# Patient Record
Sex: Female | Born: 1950 | Race: Black or African American | Hispanic: No | Smoking: Former smoker
Health system: Southern US, Community
[De-identification: ages and names within clinical notes are randomized; demographics above are authoritative.]

## PROBLEM LIST (undated history)

## (undated) DIAGNOSIS — F32A Depression, unspecified: Secondary | ICD-10-CM

## (undated) DIAGNOSIS — M810 Age-related osteoporosis without current pathological fracture: Secondary | ICD-10-CM

## (undated) HISTORY — DX: Age-related osteoporosis without current pathological fracture: M81.0

## (undated) HISTORY — DX: Depression, unspecified: F32.A

## (undated) HISTORY — PX: ABDOMINAL HYSTERECTOMY: SHX81

---

## 1992-12-19 LAB — HM PAP SMEAR: HM Pap smear: NEGATIVE

## 2004-01-01 ENCOUNTER — Other Ambulatory Visit: Payer: Self-pay

## 2005-05-27 ENCOUNTER — Emergency Department: Payer: Self-pay | Admitting: Emergency Medicine

## 2006-09-21 ENCOUNTER — Emergency Department (HOSPITAL_COMMUNITY): Admission: EM | Admit: 2006-09-21 | Discharge: 2006-09-21 | Payer: Self-pay | Admitting: Emergency Medicine

## 2007-10-09 ENCOUNTER — Emergency Department: Payer: Self-pay | Admitting: Emergency Medicine

## 2007-12-17 ENCOUNTER — Ambulatory Visit: Payer: Self-pay | Admitting: Internal Medicine

## 2010-11-15 ENCOUNTER — Encounter: Payer: Self-pay | Admitting: Family Medicine

## 2010-11-15 ENCOUNTER — Ambulatory Visit: Payer: Self-pay | Admitting: Family Medicine

## 2010-11-15 DIAGNOSIS — N760 Acute vaginitis: Secondary | ICD-10-CM

## 2010-11-15 DIAGNOSIS — R7303 Prediabetes: Secondary | ICD-10-CM | POA: Insufficient documentation

## 2010-11-15 DIAGNOSIS — R7309 Other abnormal glucose: Secondary | ICD-10-CM | POA: Insufficient documentation

## 2010-11-15 LAB — CONVERTED CEMR LAB
Bilirubin Urine: NEGATIVE
Blood in Urine, dipstick: NEGATIVE
Glucose, Urine, Semiquant: NEGATIVE
Nitrite: NEGATIVE
Specific Gravity, Urine: 1.01

## 2010-11-23 NOTE — Assessment & Plan Note (Signed)
Summary: BATERICAL INFECTION   Vital Signs:  Patient Profile:   60 Years Old Female CC:      Vaginal Discharge/Odor Weight:      117 pounds O2 Sat:      99 % O2 treatment:    Room Air Temp:     98.5 degrees F oral Pulse rate:   76 / minute Pulse rhythm:   regular Resp:     12 per minute BP sitting:   100 / 70  (right arm)  Pt. in pain?   no                   Current Allergies (reviewed today): No known allergies History of Present Illness History from: patient Reason for visit: see chief complaint Chief Complaint: Vaginal Discharge/Odor History of Present Illness: The patient is presenting today complaining of 2 weeks of noticing a fishy vaginal discharge.  She initially had some burning with urination and frequency of urination progressing to fishy vaginal odor, vaginal discharge; She had been using vaginal powder and monistat and also using some vaginal deodorant sprays with no improvement in the vaginal odors.  She said that she spoke with her pharmacist and was told that she probably had a vaginal infection.  She has not been sexually active since losing her husband 3 years ago.  She otherwise had been concerned about being a diabetic.  She said that she has not had medical care in 3 years.  She said that she was told years ago that she was "borderline diabetic" but she was never able to follow up and check on this.    REVIEW OF SYSTEMS Constitutional Symptoms       Complains of fever and fatigue.     Denies chills, night sweats, weight loss, and weight gain.  Eyes       Complains of glasses.      Denies change in vision, eye pain, eye discharge, contact lenses, and eye surgery. Ear/Nose/Throat/Mouth       Denies hearing loss/aids, change in hearing, ear pain, ear discharge, dizziness, frequent runny nose, frequent nose bleeds, sinus problems, sore throat, hoarseness, and tooth pain or bleeding.  Respiratory       Denies dry cough, productive cough, wheezing, shortness of  breath, asthma, bronchitis, and emphysema/COPD.  Cardiovascular       Denies murmurs, chest pain, and tires easily with exhertion.    Gastrointestinal       Denies stomach pain, nausea/vomiting, diarrhea, constipation, blood in bowel movements, and indigestion. Genitourniary       Denies painful urination, kidney stones, and loss of urinary control. Neurological       Complains of headaches.      Denies paralysis, seizures, and fainting/blackouts. Musculoskeletal       Denies muscle pain, joint pain, joint stiffness, decreased range of motion, redness, swelling, muscle weakness, and gout.  Skin       Denies bruising, unusual mles/lumps or sores, and hair/skin or nail changes.  Psych       Denies mood changes, temper/anger issues, anxiety/stress, speech problems, depression, and sleep problems.  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  She lives in [place], [place].  No ETOH, recreational drugs, or tobacco use reported.  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  She lives in [place], [place].  No ETOH, recreational drugs, or tobacco use reported.  Physical Exam General appearance: well developed, well nourished, no acute distress Head: normocephalic, atraumatic Eyes: conjunctivae and lids normal Pupils: equal, round, reactive to light Ears: normal, no lesions or deformities Nasal: mucosa pink,  nonedematous, no septal deviation, turbinates normal Oral/Pharynx: tongue normal, posterior pharynx without erythema or exudate Neck: neck supple,  trachea midline, no masses Chest/Lungs: no rales, wheezes, or rhonchi bilateral, breath sounds equal without effort Heart: regular rate and  rhythm, no murmur GU: deferred Extremities: normal extremities Neurological: grossly intact and non-focal Skin: no obvious rashes or lesions MSE: oriented to time, place, and person Assessment New Problems: VAGINITIS, BACTERIAL (ICD-616.10) Hx of DIABETES MELLITUS, BORDERLINE (ICD-790.29)   Patient Education: The risks, benefits and possible side effects were clearly explained and discussed with the patient.  The patient verbalized clear understanding.  The patient was given instructions to return if symptoms don't improve, worsen or new changes develop.  If it is not during clinic hours and the patient cannot get back to this clinic then the patient was told to seek medical care at an available urgent care or emergency department.  The patient verbalized understanding.   Demonstrates willingness to comply.  Plan New Medications/Changes: METRONIDAZOLE 500 MG TABS (METRONIDAZOLE) take 1 by mouth two times a day  #14 x 0, 11/15/2010, Standley Dakins MD  Follow Up: Follow up in 2-3 days if no improvement, Follow up on an as needed basis, Follow up with Primary Physician  The patient and/or caregiver has been counseled thoroughly with regard to medications prescribed including dosage, schedule, interactions, rationale for use, and possible side effects and they verbalize understanding.  Diagnoses and expected course of recovery discussed and will return if not improved as expected or if the condition worsens. Patient and/or caregiver verbalized understanding.  Prescriptions: METRONIDAZOLE 500 MG TABS (METRONIDAZOLE) take 1 by mouth two times a day  #14 x 0   Entered and Authorized by:   Standley Dakins  MD   Signed by:   Standley Dakins MD on 11/15/2010   Method used:   Electronically to        [facility] (retail)       [address]       [place], [place]  [address]       Ph: [phone]       Fax: [phone]   RxID:   [id number]   Patient Instructions: 1)  Go to the pharmacy and pick up your prescription (s).  It may take up to 30 mins for electronic prescriptions to be delivered to the pharmacy.  Please call if your pharmacy has not received your prescriptions after 30 minutes.   2)  Take your antibiotic as prescribed until ALL of it is gone, but stop if you develop a rash or swelling and contact our office as soon as possible. 3)  Return or go to the ER if no improvement or symptoms getting worse.   4)  Please call us back here  if no improvement after 2 days of antibiotics.  5)  The patient was informed that there is no on-call provider or services available at this clinic during off-hours (when the clinic is closed).  If the patient developed a problem or concern that required immediate attention, the patient was advised to go the the nearest available urgent care or emergency department for medical care.  The patient verbalized understanding.       Lab Results    Ordered by:  Standley Dakins MD    Date tests performed: 11/15/2010    Performed by:  Standley Dakins MD    Glu:     100  mg/dL Urinalysis:      Color:     Yellow    Appear:     Clear    Leuk:     Neg    Nitr:     Neg    Urobil:     0.2    Prot:     Neg    pH:     6.5    Blood:     Neg    Sp. Gr:     1.010    Ket:     Neg    Bili:     Neg    Glu:     Neg  I explained to the patient that I did not have a nurse chaperone available and therefore deferred a pelvic exam. I explained to the patient that if symptoms did not improve or if they got any worse that she should return.  The patient verbalized clear understanding.  Rodney Langton, MD, CDE, [employer]

## 2010-12-02 ENCOUNTER — Encounter: Payer: Self-pay | Admitting: Family Medicine

## 2011-09-27 ENCOUNTER — Ambulatory Visit: Payer: Self-pay | Admitting: Internal Medicine

## 2011-09-27 LAB — URINALYSIS, COMPLETE
Glucose,UR: NEGATIVE mg/dL (ref 0–75)
Ketone: NEGATIVE
Specific Gravity: 1.025 (ref 1.003–1.030)

## 2011-09-29 LAB — URINE CULTURE

## 2012-03-18 ENCOUNTER — Ambulatory Visit: Payer: Self-pay | Admitting: Pediatrics

## 2012-05-15 LAB — HM HEPATITIS C SCREENING LAB: HM Hepatitis Screen: NEGATIVE

## 2013-09-01 ENCOUNTER — Emergency Department: Payer: Self-pay | Admitting: Emergency Medicine

## 2013-09-01 LAB — CBC WITH DIFFERENTIAL/PLATELET
Basophil %: 0.6 %
Eosinophil #: 0.1 10*3/uL (ref 0.0–0.7)
Lymphocyte #: 2 10*3/uL (ref 1.0–3.6)
Lymphocyte %: 21.2 %
MCH: 30.3 pg (ref 26.0–34.0)
MCHC: 33.4 g/dL (ref 32.0–36.0)
MCV: 91 fL (ref 80–100)
Neutrophil %: 67.6 %
RBC: 4.4 10*6/uL (ref 3.80–5.20)
RDW: 12.3 % (ref 11.5–14.5)

## 2013-09-01 LAB — BASIC METABOLIC PANEL
BUN: 9 mg/dL (ref 7–18)
Calcium, Total: 9.8 mg/dL (ref 8.5–10.1)
Co2: 28 mmol/L (ref 21–32)
EGFR (African American): >60
EGFR (Non-African Amer.): >60
Osmolality: 271 (ref 275–301)
Sodium: 136 mmol/L (ref 136–145)

## 2013-09-01 LAB — URINALYSIS, COMPLETE
Bilirubin,UR: NEGATIVE
Leukocyte Esterase: NEGATIVE
Ph: 6 (ref 4.5–8.0)
Protein: NEGATIVE
WBC UR: NONE SEEN /HPF (ref 0–5)

## 2015-04-11 ENCOUNTER — Other Ambulatory Visit: Payer: Self-pay | Admitting: Family Medicine

## 2015-04-11 DIAGNOSIS — Z1231 Encounter for screening mammogram for malignant neoplasm of breast: Secondary | ICD-10-CM

## 2015-04-11 DIAGNOSIS — F339 Major depressive disorder, recurrent, unspecified: Secondary | ICD-10-CM | POA: Insufficient documentation

## 2015-04-13 ENCOUNTER — Ambulatory Visit
Admission: RE | Admit: 2015-04-13 | Discharge: 2015-04-13 | Disposition: A | Discharge status: Home / Self Care | Payer: No Typology Code available for payment source | Source: Ambulatory Visit | Attending: Family Medicine | Admitting: Family Medicine

## 2015-04-13 DIAGNOSIS — Z1231 Encounter for screening mammogram for malignant neoplasm of breast: Secondary | ICD-10-CM | POA: Insufficient documentation

## 2015-06-04 ENCOUNTER — Encounter: Payer: Self-pay | Admitting: Emergency Medicine

## 2015-06-04 ENCOUNTER — Emergency Department
Admission: EM | Admit: 2015-06-04 | Discharge: 2015-06-04 | Disposition: A | Discharge status: Home / Self Care | Payer: No Typology Code available for payment source | Attending: Emergency Medicine | Admitting: Emergency Medicine

## 2015-06-04 ENCOUNTER — Emergency Department: Payer: No Typology Code available for payment source

## 2015-06-04 DIAGNOSIS — S20219A Contusion of unspecified front wall of thorax, initial encounter: Secondary | ICD-10-CM | POA: Insufficient documentation

## 2015-06-04 DIAGNOSIS — S3991XA Unspecified injury of abdomen, initial encounter: Secondary | ICD-10-CM | POA: Insufficient documentation

## 2015-06-04 DIAGNOSIS — Y998 Other external cause status: Secondary | ICD-10-CM | POA: Insufficient documentation

## 2015-06-04 DIAGNOSIS — Z87891 Personal history of nicotine dependence: Secondary | ICD-10-CM | POA: Diagnosis not present

## 2015-06-04 DIAGNOSIS — Y9389 Activity, other specified: Secondary | ICD-10-CM | POA: Diagnosis not present

## 2015-06-04 DIAGNOSIS — S0990XA Unspecified injury of head, initial encounter: Secondary | ICD-10-CM | POA: Diagnosis not present

## 2015-06-04 DIAGNOSIS — S161XXA Strain of muscle, fascia and tendon at neck level, initial encounter: Secondary | ICD-10-CM | POA: Insufficient documentation

## 2015-06-04 DIAGNOSIS — Y92481 Parking lot as the place of occurrence of the external cause: Secondary | ICD-10-CM | POA: Insufficient documentation

## 2015-06-04 DIAGNOSIS — S199XXA Unspecified injury of neck, initial encounter: Secondary | ICD-10-CM | POA: Diagnosis present

## 2015-06-04 MED ORDER — CYCLOBENZAPRINE HCL 10 MG PO TABS: 10.0000 mg | ORAL_TABLET | Freq: Every evening | ORAL | Status: AC | PRN

## 2015-06-04 MED ORDER — KETOROLAC TROMETHAMINE 30 MG/ML IJ SOLN
30.0000 mg | Freq: Once | INTRAMUSCULAR | Status: AC
  Administered 2015-06-04: 30 mg via INTRAMUSCULAR
  Filled 2015-06-04: qty 1

## 2015-06-04 MED ORDER — NAPROXEN 500 MG PO TABS: 500.0000 mg | ORAL_TABLET | Freq: Two times a day (BID) | ORAL | Status: AC
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
## 2016-04-12 LAB — LIPID PANEL
Cholesterol: 162 (ref 0–200)
HDL: 52 (ref 35–70)
LDL Cholesterol: 88
LDl/HDL Ratio: 3.1
Triglycerides: 110 (ref 40–160)
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
## 2018-05-21 LAB — BASIC METABOLIC PANEL
BUN: 11 (ref 4–21)
CO2: 34 — AB (ref 13–22)
Chloride: 106 (ref 99–108)
Creatinine: 0.7 (ref 0.5–1.1)
Glucose: 85
Potassium: 4.3 (ref 3.4–5.3)
Sodium: 141 (ref 137–147)

## 2018-05-21 LAB — HEPATIC FUNCTION PANEL
ALT: 18 (ref 7–35)
AST: 15 (ref 13–35)
Alkaline Phosphatase: 73 (ref 25–125)
Bilirubin, Total: 0.2

## 2018-05-21 LAB — HEMOGLOBIN A1C: Hemoglobin A1C: 5.7

## 2018-05-21 LAB — COMPREHENSIVE METABOLIC PANEL: Calcium: 9.2 (ref 8.7–10.7)

## 2018-10-13 ENCOUNTER — Other Ambulatory Visit: Payer: Self-pay

## 2018-10-13 ENCOUNTER — Emergency Department
Admission: EM | Admit: 2018-10-13 | Discharge: 2018-10-13 | Disposition: A | Discharge status: Home / Self Care | Payer: Medicare HMO | Attending: Emergency Medicine | Admitting: Emergency Medicine

## 2018-10-13 DIAGNOSIS — R21 Rash and other nonspecific skin eruption: Secondary | ICD-10-CM | POA: Diagnosis present

## 2018-10-13 DIAGNOSIS — B029 Zoster without complications: Secondary | ICD-10-CM | POA: Diagnosis not present

## 2018-10-13 DIAGNOSIS — E119 Type 2 diabetes mellitus without complications: Secondary | ICD-10-CM | POA: Diagnosis not present

## 2018-10-13 DIAGNOSIS — Z87891 Personal history of nicotine dependence: Secondary | ICD-10-CM | POA: Diagnosis not present

## 2018-10-13 MED ORDER — OXYCODONE-ACETAMINOPHEN 5-325 MG PO TABS: 1.0000 | ORAL_TABLET | Freq: Three times a day (TID) | ORAL | 0 refills | Status: AC | PRN

## 2018-10-13 MED ORDER — VALACYCLOVIR HCL 1 G PO TABS: 1000.0000 mg | ORAL_TABLET | Freq: Three times a day (TID) | ORAL | 0 refills | Status: AC

## 2018-10-13 NOTE — ED Provider Notes (Signed)
[facility] Emergency Department Provider Note  ____________________________________________  Time seen: Approximately 8:59 PM  I have reviewed the triage vital signs and the nursing notes.   HISTORY  Chief Complaint Rash    HPI Mercedes Mcintosh is a 68 y.o. female presents to the emergency department with an erythematous rash with vesicular formation along the right side of abdomen with a prodrome of burning.  Rash does not cross the midline.  No fever or chills. No alleviating measures have been attempted.   History reviewed. No pertinent past medical history.  Patient Active Problem List   Diagnosis Date Noted  . DIABETES MELLITUS, BORDERLINE 11/15/2010    Past Surgical History:  Procedure Laterality Date  . ABDOMINAL HYSTERECTOMY      Prior to Admission medications   Medication Sig Start Date End Date Taking? Authorizing Provider  oxyCODONE-acetaminophen (PERCOCET/ROXICET) 5-325 MG tablet Take 1 tablet by mouth every 8 (eight) hours as needed for up to 3 days for severe pain. 10/13/18 10/16/18  Orvil FeilWoods, Erric Machnik M, PA-C  valACYclovir (VALTREX) 1000 MG tablet Take 1 tablet (1,000 mg total) by mouth 3 (three) times daily for 7 days. 10/13/18 10/20/18  Orvil FeilWoods, Irianna Gilday M, PA-C    Allergies Patient has no known allergies.  Family History  Problem Relation Age of Onset  . Breast cancer Paternal Aunt     Social History Social History   Tobacco Use  . Smoking status: Former Games developermoker  . Smokeless tobacco: Never Used  Substance Use Topics  . Alcohol use: No  . Drug use: No     Review of Systems  Constitutional: No fever/chills Eyes: No visual changes. No discharge ENT: No upper respiratory complaints. Cardiovascular: no chest pain. Respiratory: no cough. No SOB. Gastrointestinal: No abdominal pain.  No nausea, no vomiting.  No diarrhea.  No constipation. Genitourinary: Negative for dysuria. No hematuria Musculoskeletal: Negative for musculoskeletal  pain. Skin: Patient has rash.  Neurological: Negative for headaches, focal weakness or numbness.   ____________________________________________   PHYSICAL EXAM:  VITAL SIGNS: ED Triage Vitals  Enc Vitals Group     BP 10/13/18 1930 116/63     Pulse Rate 10/13/18 1930 90     Resp 10/13/18 1930 17     Temp 10/13/18 1930 98.3 F (36.8 C)     Temp Source 10/13/18 1930 Oral     SpO2 10/13/18 1930 97 %     Weight 10/13/18 1928 118 lb (53.5 kg)     Height 10/13/18 1928 5\' 4"  (1.626 m)     Head Circumference --      Peak Flow --      Pain Score 10/13/18 1928 10     Pain Loc --      Pain Edu? --      Excl. in GC? --      Constitutional: Alert and oriented. Well appearing and in no acute distress. Eyes: Conjunctivae are normal. PERRL. EOMI. Head: Atraumatic. Cardiovascular: Normal rate, regular rhythm. Normal S1 and S2.  Good peripheral circulation. Respiratory: Normal respiratory effort without tachypnea or retractions. Lungs CTAB. Good air entry to the bases with no decreased or absent breath sounds. Gastrointestinal: Bowel sounds 4 quadrants. Soft and nontender to palpation. No guarding or rigidity. No palpable masses. No distention. No CVA tenderness. Musculoskeletal: Full range of motion to all extremities. No gross deformities appreciated. Neurologic:  Normal speech and language. No gross focal neurologic deficits are appreciated.  Skin: Patient has an erythematous, macular rash with vesicular formation  along the right abdomen that does not cross the midline. Psychiatric: Mood and affect are normal. Speech and behavior are normal. Patient exhibits appropriate insight and judgement.   ____________________________________________   LABS (all labs ordered are listed, but only abnormal results are displayed)  Labs Reviewed - No data to display ____________________________________________  EKG   ____________________________________________  RADIOLOGY   No results  found.  ____________________________________________    PROCEDURES  Procedure(s) performed:    Procedures    Medications - No data to display   ____________________________________________   INITIAL IMPRESSION / ASSESSMENT AND PLAN / ED COURSE  Pertinent labs & imaging results that were available during my care of the patient were reviewed by me and considered in my medical decision making (see chart for details).  Review of the [place] CSRS was performed in accordance of the [facility] prior to dispensing any controlled drugs.  Assessment and Plan:  Shingles Patient presents to the emergency department with a rash consistent with shingles.  Patient was discharged with Valtrex and Roxicet.  She was advised to follow-up with primary care as needed.  All patient questions were answered.    ____________________________________________  FINAL CLINICAL IMPRESSION(S) / ED DIAGNOSES  Final diagnoses:  Herpes zoster without complication      NEW MEDICATIONS STARTED DURING THIS VISIT:  ED Discharge Orders         Ordered    valACYclovir (VALTREX) 1000 MG tablet  3 times daily     10/13/18 2054    oxyCODONE-acetaminophen (PERCOCET/ROXICET) 5-325 MG tablet  Every 8 hours PRN     10/13/18 2055              This chart was dictated using voice recognition software/Dragon. Despite best efforts to proofread, errors can occur which can change the meaning. Any change was purely unintentional.    Gasper Lloyd 10/13/18 2101    Rockne Menghini, MD 10/13/18 [phone]

## 2018-10-13 NOTE — ED Triage Notes (Signed)
Pt arrives to ED via POV from home with rash x4 days. Pt presents with raised red rash to right upper abdomen and under the right breast. Pt reports rash is painful and burning. Pt reports having chicken pox as a child. No fever. No recent travel.

## 2018-10-13 NOTE — ED Notes (Addendum)
Pt states having a "rash" for the past 3-4days that looks like shingles. Pt states nausea, jitteriness, pain and redness that is spreading. Family at bedside.

## 2019-06-27 ENCOUNTER — Emergency Department: Payer: Medicare HMO

## 2019-06-27 ENCOUNTER — Other Ambulatory Visit: Payer: Self-pay

## 2019-06-27 ENCOUNTER — Encounter: Payer: Self-pay | Admitting: Emergency Medicine

## 2019-06-27 ENCOUNTER — Emergency Department
Admission: EM | Admit: 2019-06-27 | Discharge: 2019-06-27 | Disposition: A | Discharge status: Home / Self Care | Payer: Medicare HMO | Attending: Emergency Medicine | Admitting: Emergency Medicine

## 2019-06-27 DIAGNOSIS — T180XXA Foreign body in mouth, initial encounter: Secondary | ICD-10-CM

## 2019-06-27 DIAGNOSIS — R0989 Other specified symptoms and signs involving the circulatory and respiratory systems: Secondary | ICD-10-CM | POA: Diagnosis not present

## 2019-06-27 DIAGNOSIS — Z20828 Contact with and (suspected) exposure to other viral communicable diseases: Secondary | ICD-10-CM | POA: Diagnosis not present

## 2019-06-27 DIAGNOSIS — Z87891 Personal history of nicotine dependence: Secondary | ICD-10-CM | POA: Insufficient documentation

## 2019-06-27 DIAGNOSIS — E119 Type 2 diabetes mellitus without complications: Secondary | ICD-10-CM | POA: Insufficient documentation

## 2019-06-27 DIAGNOSIS — Z20822 Contact with and (suspected) exposure to covid-19: Secondary | ICD-10-CM

## 2019-06-27 MED ORDER — ONDANSETRON 4 MG PO TBDP: 4.0000 mg | ORAL_TABLET | Freq: Three times a day (TID) | ORAL | 0 refills | Status: DC | PRN

## 2019-06-27 MED ORDER — EXCEDRIN MIGRAINE 250-250-65 MG PO TABS: 1.0000 | ORAL_TABLET | Freq: Four times a day (QID) | ORAL | 0 refills | Status: DC | PRN

## 2019-06-27 NOTE — ED Notes (Addendum)
Refer to triage note: st would like to get tested for COVID. Pt st would like to speak to a provider for further evaluation. Pt st bottom tooth is sore, headache, nausea after biting down on  a tooth from a biscuit.

## 2019-06-27 NOTE — ED Notes (Signed)
FIRST NURSE NOTE:  Pt states she ate a sausage biscuit this morning, states when she bit down she bit into someone's tooth that was in the sandwich. Pt states it was not her tooth. Pt states she has photo on her phone.  Pt also concerned about covid and wants a covid test.

## 2019-06-27 NOTE — ED Triage Notes (Signed)
Pt wants a covid test to make sure she did not get covid from possibly eating a piece of tooth from a biscuit this morning.  Informed patient would not get covid that quickly but pt would like test anyway.  Pt initially reported no symptoms but then said her throat was a little scratchy. No choking. Pt reports she has tooth with her and a picture of it.

## 2019-06-27 NOTE — ED Provider Notes (Signed)
[facility] Emergency Department Provider Note  ____________________________________________  Time seen: Approximately 8:21 PM  I have reviewed the triage vital signs and the nursing notes.   HISTORY  Chief Complaint Foreign Body    HPI Mercedes Mcintosh is a 68 y.o. female who presents the emergency department concern over possibly swallowing a foreign body.  Patient reports that this morning she went to basketball for viscus for herself and her grandson.  She was eating a biscuit when she felt a crunch sensation.  Patient states that at first she thought she had eaten some bristle but when she spit it out it appeared toothlike.  Patient was in disbelief that it was a tooth so she continued to eat her biscuit.  Patient reports that she felt another crunch, spit out what appears to be a filling, likely from tooth like object in her biscuit.  Patient does not believe she swallowed any foreign body but is unsure.  She presents for evaluation of this.  She states that she has a partial on the upper so this did not make direct contact with her own dentition but she is having pain underlying the partial from contact with a foreign body.  Patient also request COVID-19 testing.  She has no fevers or chills, nasal congestion, sore throat, cough.  She states that she was wanting to get tested prior to this, but after having a potential foreign object from another person inside her mouth she especially wants testing at this time.  No other complaints at this time.  No medications prior to arrival.         History reviewed. No pertinent past medical history.  Patient Active Problem List   Diagnosis Date Noted  . DIABETES MELLITUS, BORDERLINE 11/15/2010    Past Surgical History:  Procedure Laterality Date  . ABDOMINAL HYSTERECTOMY      Prior to Admission medications   Medication Sig Start Date End Date Taking? Authorizing Provider  aspirin-acetaminophen-caffeine  (EXCEDRIN MIGRAINE) [phone] MG tablet Take 1 tablet by mouth every 6 (six) hours as needed for headache. 06/27/19   , Charline Bills, PA-C  ondansetron (ZOFRAN-ODT) 4 MG disintegrating tablet Take 1 tablet (4 mg total) by mouth every 8 (eight) hours as needed for nausea or vomiting. 06/27/19   , Charline Bills, PA-C    Allergies Patient has no known allergies.  Family History  Problem Relation Age of Onset  . Breast cancer Paternal Aunt     Social History Social History   Tobacco Use  . Smoking status: Former Research scientist (life sciences)  . Smokeless tobacco: Never Used  Substance Use Topics  . Alcohol use: No  . Drug use: No     Review of Systems  Constitutional: No fever/chills Eyes: No visual changes. No discharge ENT: No upper respiratory complaints. Cardiovascular: no chest pain. Respiratory: no cough. No SOB. Gastrointestinal: Possible ingested foreign body.  No abdominal pain.  No nausea, no vomiting.  No diarrhea.  No constipation. Musculoskeletal: Negative for musculoskeletal pain. Skin: Negative for rash, abrasions, lacerations, ecchymosis. Neurological: Negative for headaches, focal weakness or numbness. 10-point ROS otherwise negative.  ____________________________________________   PHYSICAL EXAM:  VITAL SIGNS: ED Triage Vitals  Enc Vitals Group     BP 06/27/19 1849 112/68     Pulse Rate 06/27/19 1849 81     Resp 06/27/19 1849 14     Temp 06/27/19 1849 99 F (37.2 C)     Temp Source 06/27/19 1849 Oral     SpO2  06/27/19 1849 97 %     Weight 06/27/19 1848 112 lb (50.8 kg)     Height 06/27/19 1848 5\' 3"  (1.6 m)     Head Circumference --      Peak Flow --      Pain Score 06/27/19 1848 0     Pain Loc --      Pain Edu? --      Excl. in GC? --      Constitutional: Alert and oriented. Well appearing and in no acute distress. Eyes: Conjunctivae are normal. PERRL. EOMI. Head: Atraumatic. ENT:      Ears:       Nose: No congestion/rhinnorhea.       Mouth/Throat: Mucous membranes are moist.  Visualization of the oral cavity reveals no gross signs of trauma from possible foreign body.  There is no intraoral lacerations, erythema or edema.  Patient does have a partial to the upper dentition.  No trauma identified to the partial.  Patient does have broken dentition to the left lower dentition.  Picture below.  Patient reports that this tooth did not have any filling. Neck: No stridor.  Neck is supple full range of motion Hematological/Lymphatic/Immunilogical: No cervical lymphadenopathy. Cardiovascular: Normal rate, regular rhythm. Normal S1 and S2.  Good peripheral circulation. Respiratory: Normal respiratory effort without tachypnea or retractions. Lungs CTAB. Good air entry to the bases with no decreased or absent breath sounds. Gastrointestinal: Bowel sounds 4 quadrants. Soft and nontender to palpation. No guarding or rigidity. No palpable masses. No distention. No CVA tenderness. Musculoskeletal: Full range of motion to all extremities. No gross deformities appreciated. Neurologic:  Normal speech and language. No gross focal neurologic deficits are appreciated.  Skin:  Skin is warm, dry and intact. No rash noted. Psychiatric: Mood and affect are normal. Speech and behavior are normal. Patient exhibits appropriate insight and judgement.     ____________________________________________   LABS (all labs ordered are listed, but only abnormal results are displayed)  Labs Reviewed  NOVEL CORONAVIRUS, NAA (HOSP ORDER, SEND-OUT TO REF LAB; TAT 18-24 HRS)   ____________________________________________  EKG   ____________________________________________  RADIOLOGY I personally viewed and evaluated these images as part of my medical decision making, as well as reviewing the written report by the radiologist.  Dg Chest 1 View  Result Date: 06/27/2019 CLINICAL DATA:  Possible foreign body ingestion. EXAM: CHEST  1 VIEW; ABDOMEN - 1 VIEW  COMPARISON:  Chest and abdominal radiograph 06/04/2015 FINDINGS: No new or unexpected radiopaque foreign body identified in the chest, abdomen or pelvis. The heart size and mediastinal contours are within normal limits. The lungs are clear. The visualized skeletal structures are unremarkable. Nonobstructive bowel gas pattern. A few tiny radiopaque densities in the pelvis likely represent vascular phleboliths. IMPRESSION: No new or unexpected radiopaque foreign body identified. Electronically Signed   By: Emmaline KluverNancy  Ballantyne M.D.   On: 06/27/2019 20:52   Dg Abdomen 1 View  Result Date: 06/27/2019 CLINICAL DATA:  Possible foreign body ingestion. EXAM: CHEST  1 VIEW; ABDOMEN - 1 VIEW COMPARISON:  Chest and abdominal radiograph 06/04/2015 FINDINGS: No new or unexpected radiopaque foreign body identified in the chest, abdomen or pelvis. The heart size and mediastinal contours are within normal limits. The lungs are clear. The visualized skeletal structures are unremarkable. Nonobstructive bowel gas pattern. A few tiny radiopaque densities in the pelvis likely represent vascular phleboliths. IMPRESSION: No new or unexpected radiopaque foreign body identified. Electronically Signed   By: Adline PotterNancy  Ballantyne M.D.  On: 06/27/2019 20:52    ____________________________________________    PROCEDURES  Procedure(s) performed:    Procedures    Medications - No data to display        ____________________________________________   INITIAL IMPRESSION / ASSESSMENT AND PLAN / ED COURSE  Pertinent labs & imaging results that were available during my care of the patient were reviewed by me and considered in my medical decision making (see chart for details).  Review of the [place] CSRS was performed in accordance of the [facility] prior to dispensing any controlled drugs.           Patient's diagnosis is consistent with foreign body in the mouth, encounter for COVID-19 test.  Patient presented for evaluation  for possible ingested foreign body.  While eating a biscuit this morning from biscuit pill, patient found a foreign body consistent with tooth and dental filling.  Patient does not believe she swallowed any foreign body, and imaging does not revealed any significant radiopaque foreign body.  Patient does have a tooth that she reports is broken.  Patient denied any filling to this tooth.  Patient's pictures are included in the chart from today's foreign body in her biscuit.  Patient was also requesting COVID-19 test.  Patient will have COVID-19 testing in patient will be called with any positive results.  No further work-up at this time.  Follow-up with primary care as needed.  Patient is given ED precautions to return to the ED for any worsening or new symptoms.     ____________________________________________  FINAL CLINICAL IMPRESSION(S) / ED DIAGNOSES  Final diagnoses:  Foreign body in mouth, initial encounter  Encounter for laboratory testing for COVID-19 virus      NEW MEDICATIONS STARTED DURING THIS VISIT:  ED Discharge Orders         Ordered    aspirin-acetaminophen-caffeine (EXCEDRIN MIGRAINE) 250-250-65 MG tablet  Every 6 hours PRN     06/27/19 2116    ondansetron (ZOFRAN-ODT) 4 MG disintegrating tablet  Every 8 hours PRN     06/27/19 2116              This chart was dictated using voice recognition software/Dragon. Despite best efforts to proofread, errors can occur which can change the meaning. Any change was purely unintentional.    Racheal Patches, PA-C 06/27/19 2118    Dionne Bucy, MD 06/27/19 2241

## 2019-06-29 LAB — NOVEL CORONAVIRUS, NAA (HOSP ORDER, SEND-OUT TO REF LAB; TAT 18-24 HRS): SARS-CoV-2, NAA: NOT DETECTED

## 2020-02-04 ENCOUNTER — Other Ambulatory Visit: Payer: Self-pay

## 2020-02-04 ENCOUNTER — Ambulatory Visit (INDEPENDENT_AMBULATORY_CARE_PROVIDER_SITE_OTHER): Payer: Medicare HMO | Admitting: Family Medicine

## 2020-02-04 ENCOUNTER — Encounter: Payer: Self-pay | Admitting: Family Medicine

## 2020-02-04 VITALS — BP 90/52 | HR 81 | Temp 98.4°F | Resp 18 | Ht 62.25 in | Wt 114.5 lb

## 2020-02-04 DIAGNOSIS — R519 Headache, unspecified: Secondary | ICD-10-CM | POA: Diagnosis not present

## 2020-02-04 DIAGNOSIS — R7309 Other abnormal glucose: Secondary | ICD-10-CM

## 2020-02-04 DIAGNOSIS — M5441 Lumbago with sciatica, right side: Secondary | ICD-10-CM

## 2020-02-04 DIAGNOSIS — M5442 Lumbago with sciatica, left side: Secondary | ICD-10-CM | POA: Diagnosis not present

## 2020-02-04 DIAGNOSIS — G8929 Other chronic pain: Secondary | ICD-10-CM | POA: Insufficient documentation

## 2020-02-04 MED ORDER — PREDNISONE 20 MG PO TABS: ORAL_TABLET | ORAL | 0 refills | Status: AC

## 2020-02-04 NOTE — Assessment & Plan Note (Signed)
Reviewed prior Hgb A1c 5.7. Not currently on medication.

## 2020-02-04 NOTE — Progress Notes (Signed)
Subjective:     Mercedes Mcintosh is a 69 y.o. female presenting for Back Pain (lower back and radiating down to both legs. Started 2 months ago. She did see [facility] about 2 years ago for the same symptoms and received an injection in her spine at that time that helped. This time symptoms are more intense)     Back Pain This is a recurrent problem. The current episode started more than 1 month ago. The problem occurs daily. The problem has been gradually worsening since onset. The pain is present in the lumbar spine. The quality of the pain is described as aching. The pain radiates to the left thigh and right thigh. The pain is the same all the time. Associated symptoms include leg pain and weakness. Pertinent negatives include no bladder incontinence, bowel incontinence, dysuria, headaches, numbness or tingling. She has tried NSAIDs, analgesics and home exercises for the symptoms. The treatment provided mild relief.    Got an injection 2 years ago with some improvement for a few months - symptoms came back after a few months but have worsened in the last 2 months  Hx of XR and MRI   Review of Systems  Gastrointestinal: Negative for bowel incontinence.  Genitourinary: Negative for bladder incontinence and dysuria.  Musculoskeletal: Positive for back pain.  Neurological: Positive for weakness. Negative for tingling, numbness and headaches.     Social History   Tobacco Use  Smoking Status Former Smoker  Smokeless Tobacco Never Used        Objective:    BP Readings from Last 3 Encounters:  02/04/20 (!) 90/52  06/27/19 (!) 116/45  10/13/18 105/68   Wt Readings from Last 3 Encounters:  02/04/20 114 lb 8 oz (51.9 kg)  06/27/19 112 lb (50.8 kg)  10/13/18 118 lb (53.5 kg)    BP (!) 90/52   Pulse 81   Temp 98.4 F (36.9 C)   Resp 18   Ht 5' 2.25" (1.581 m)   Wt 114 lb 8 oz (51.9 kg)   SpO2 95%   BMI 20.77 kg/m    Physical Exam Constitutional:      General:  She is not in acute distress.    Appearance: She is well-developed. She is not diaphoretic.  HENT:     Right Ear: External ear normal.     Left Ear: External ear normal.  Eyes:     Conjunctiva/sclera: Conjunctivae normal.  Cardiovascular:     Rate and Rhythm: Normal rate.  Pulmonary:     Effort: Pulmonary effort is normal.  Musculoskeletal:     Cervical back: Neck supple.     Comments: Back: Inspection: Difficulty getting to standing and walking with slight limp, no deformities Palpation: TTP along the lowest lumbar spine midline and bilateral ROM: pain with flexion, normal extension, rotation, lateral flexion Strength: normal Reflexes normal Straight leg raise on the left was positive.   Skin:    General: Skin is warm and dry.     Capillary Refill: Capillary refill takes less than 2 seconds.  Neurological:     Mental Status: She is alert. Mental status is at baseline.  Psychiatric:        Mood and Affect: Mood normal.        Behavior: Behavior normal.           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 Given presence of sciatic symptoms discussed trial of steroids and PT. If not improving recommended returning to [facility] as she notes prior MRI done with them 2 years ago.       Relevant Medications   escitalopram (LEXAPRO) 20 MG tablet   predniSONE (DELTASONE) 20 MG tablet   Other Relevant Orders   Ambulatory referral to Physical Therapy     Other   DIABETES MELLITUS, BORDERLINE    Reviewed prior Hgb A1c 5.7. Not currently on medication.       Headache    Pt notes hx of migraines and other headaches. Discussed OK for prn excedrine if taking <2 times per week. Which she noted is getting fewer headaches than that.       Relevant Medications   escitalopram (LEXAPRO) 20 MG  tablet       Return in about 8 weeks (around 03/31/2020).  Lynnda Child, MD

## 2020-02-04 NOTE — Assessment & Plan Note (Signed)
Pt notes hx of migraines and other headaches. Discussed OK for prn excedrine if taking <2 times per week. Which she noted is getting fewer headaches than that.

## 2020-02-04 NOTE — Assessment & Plan Note (Signed)
Reports some improvement with injection, but now worsening pain. Given presence of sciatic symptoms discussed trial of steroids and PT. If not improving recommended returning to [facility] as she notes prior MRI done with them 2 years ago.

## 2020-02-04 NOTE — Patient Instructions (Addendum)
#  Back pain - take Prednisone for 9 days   #Referral I have placed a referral to a specialist for you. You should receive a phone call from the specialty office. Make sure your voicemail is not full and that if you are able to answer your phone to unknown or new numbers.   It may take up to 2 weeks to hear about the referral. If you do not hear anything in 2 weeks, please call our office and ask to speak with the referral coordinator.    Come back for your appointment in July

## 2020-03-29 ENCOUNTER — Encounter: Payer: Self-pay | Admitting: Family Medicine

## 2020-03-29 ENCOUNTER — Ambulatory Visit (INDEPENDENT_AMBULATORY_CARE_PROVIDER_SITE_OTHER): Payer: Medicare HMO | Admitting: Family Medicine

## 2020-03-29 ENCOUNTER — Other Ambulatory Visit: Payer: Self-pay

## 2020-03-29 VITALS — BP 80/58 | HR 88 | Temp 98.0°F | Ht 62.5 in | Wt 116.2 lb

## 2020-03-29 DIAGNOSIS — Z13220 Encounter for screening for lipoid disorders: Secondary | ICD-10-CM | POA: Diagnosis not present

## 2020-03-29 DIAGNOSIS — M5441 Lumbago with sciatica, right side: Secondary | ICD-10-CM | POA: Diagnosis not present

## 2020-03-29 DIAGNOSIS — G8929 Other chronic pain: Secondary | ICD-10-CM

## 2020-03-29 DIAGNOSIS — R7303 Prediabetes: Secondary | ICD-10-CM

## 2020-03-29 DIAGNOSIS — I959 Hypotension, unspecified: Secondary | ICD-10-CM | POA: Diagnosis not present

## 2020-03-29 DIAGNOSIS — M25511 Pain in right shoulder: Secondary | ICD-10-CM | POA: Diagnosis not present

## 2020-03-29 DIAGNOSIS — F3289 Other specified depressive episodes: Secondary | ICD-10-CM

## 2020-03-29 DIAGNOSIS — E2839 Other primary ovarian failure: Secondary | ICD-10-CM

## 2020-03-29 DIAGNOSIS — M5442 Lumbago with sciatica, left side: Secondary | ICD-10-CM | POA: Diagnosis not present

## 2020-03-29 LAB — COMPREHENSIVE METABOLIC PANEL
ALT: 16 U/L (ref 0–35)
AST: 17 U/L (ref 0–37)
Albumin: 3.9 g/dL (ref 3.5–5.2)
Alkaline Phosphatase: 67 U/L (ref 39–117)
BUN: 11 mg/dL (ref 6–23)
CO2: 31 mEq/L (ref 19–32)
Calcium: 9.3 mg/dL (ref 8.4–10.5)
Chloride: 105 mEq/L (ref 96–112)
Creatinine, Ser: 0.75 mg/dL (ref 0.40–1.20)
GFR: 93.2 mL/min (ref >60.00)
Glucose, Bld: 77 mg/dL (ref 70–99)
Potassium: 4.3 mEq/L (ref 3.5–5.1)
Sodium: 140 mEq/L (ref 135–145)
Total Bilirubin: 0.3 mg/dL (ref 0.2–1.2)
Total Protein: 6.3 g/dL (ref 6.0–8.3)

## 2020-03-29 LAB — LIPID PANEL
Cholesterol: 183 mg/dL (ref 0–200)
HDL: 65.3 mg/dL (ref >39.00)
LDL Cholesterol: 102 mg/dL — ABNORMAL HIGH (ref 0–99)
NonHDL: 117.31
Total CHOL/HDL Ratio: 3
Triglycerides: 76 mg/dL (ref 0.0–149.0)
VLDL: 15.2 mg/dL (ref 0.0–40.0)

## 2020-03-29 LAB — HEMOGLOBIN A1C: Hgb A1c MFr Bld: 5.6 % (ref 4.6–6.5)

## 2020-03-29 MED ORDER — PREDNISONE 20 MG PO TABS: ORAL_TABLET | ORAL | 0 refills | Status: AC

## 2020-03-29 MED ORDER — ESCITALOPRAM OXALATE 20 MG PO TABS: 20.0000 mg | ORAL_TABLET | Freq: Every day | ORAL | 1 refills | Status: DC | PRN

## 2020-03-29 NOTE — Assessment & Plan Note (Signed)
Taking lexapro prn about 1 time per week. She feels this is effective. Discussed daily dosing if symptoms occurring >3 times a per week

## 2020-03-29 NOTE — Progress Notes (Signed)
Subjective:     Mercedes Mcintosh is a 69 y.o. female presenting for Establish Care, Shoulder Pain (right), and Back Pain (Sciatica- wants to discuss meds and PT )     HPI  #Back pain - prednisone course helped  - still having back pain and leg pain on both side  #Right shoulder - daily pain - anterior shoulder - on the bone - fluttering symptom - on and off daily - feels like a muscle twitching - denies palpitations of her heart  Review of Systems  02/04/2020: Clinic - Back pain - steroids and PT, DM- diet controlled, HA - excedrine  Social History   Tobacco Use  Smoking Status Former Smoker  . Packs/day: 0.10  . Years: 8.00  . Pack years: 0.80  . Quit date: 2012  . Years since quitting: 9.5  Smokeless Tobacco Never Used        Objective:    BP Readings from Last 3 Encounters:  03/29/20 (!) 80/58  02/04/20 (!) 90/52  06/27/19 (!) 116/45   Wt Readings from Last 3 Encounters:  03/29/20 116 lb 4 oz (52.7 kg)  02/04/20 114 lb 8 oz (51.9 kg)  06/27/19 112 lb (50.8 kg)    BP (!) 80/58   Pulse 88   Temp 98 F (36.7 C) (Temporal)   Ht 5' 2.5" (1.588 m)   Wt 116 lb 4 oz (52.7 kg)   SpO2 97%   BMI 20.92 kg/m    Physical Exam Constitutional:      General: She is not in acute distress.    Appearance: She is well-developed. She is not diaphoretic.  HENT:     Right Ear: External ear normal.     Left Ear: External ear normal.  Eyes:     Conjunctiva/sclera: Conjunctivae normal.  Cardiovascular:     Rate and Rhythm: Normal rate and regular rhythm.     Heart sounds: No murmur heard.   Pulmonary:     Effort: Pulmonary effort is normal. No respiratory distress.     Breath sounds: Normal breath sounds. No wheezing.  Musculoskeletal:     Cervical back: Neck supple.     Comments: Right shoulder Inspection: Poor posture, with shoulders rotated anterior Palpation: TTP along the pectoralis muscle ROM: normal shoulder w/o pain Strength: normal No  impingment signs  Skin:    General: Skin is warm and dry.     Capillary Refill: Capillary refill takes less than 2 seconds.  Neurological:     Mental Status: She is alert. Mental status is at baseline.  Psychiatric:        Mood and Affect: Mood normal.        Behavior: Behavior normal.           Assessment & Plan:   Problem List Items Addressed This Visit      Cardiovascular and Mediastinum   Hypotension    BP usually runs low per patient. Occasional dizziness. Encouraged hydration and will continue to monitor. Blood work today      Relevant Orders   Comprehensive metabolic panel     Nervous and Auditory   Chronic bilateral low back pain with bilateral sciatica - Primary    Persistent leg symptoms. Did get better with steroids but symptoms returned. Will try another course and pt now has more time for PT. New referral as issues getting into her previous provided - she will call to see if she can get an appointment. If not improving with PT -  recommend ortho referral      Relevant Medications   escitalopram (LEXAPRO) 20 MG tablet   Ibuprofen-Acetaminophen (ADVIL DUAL ACTION) 125-250 MG TABS   predniSONE (DELTASONE) 20 MG tablet   Other Relevant Orders   Ambulatory referral to Physical Therapy     Other   Prediabetes    Encouraged healthy diet/exercise. Labs today      Relevant Orders   Hemoglobin A1c   Depression    Taking lexapro prn about 1 time per week. She feels this is effective. Discussed daily dosing if symptoms occurring >3 times a per week      Relevant Medications   escitalopram (LEXAPRO) 20 MG tablet   Chronic right shoulder pain    Suspect pectoralis muscle tightness and posture related pain. Pectoralis muscles stretch reviewed. PT referral      Relevant Medications   escitalopram (LEXAPRO) 20 MG tablet   Ibuprofen-Acetaminophen (ADVIL DUAL ACTION) 125-250 MG TABS   predniSONE (DELTASONE) 20 MG tablet   Other Relevant Orders   Ambulatory  referral to Physical Therapy    Other Visit Diagnoses    Screening for hyperlipidemia       Relevant Orders   Lipid panel   Estrogen deficiency       Relevant Orders   DG Bone Density       Return in about 6 weeks (around 05/10/2020).  Lynnda Child, MD  This visit occurred during the SARS-CoV-2 public health emergency.  Safety protocols were in place, including screening questions prior to the visit, additional usage of staff PPE, and extensive cleaning of exam room while observing appropriate contact time as indicated for disinfecting solutions.

## 2020-03-29 NOTE — Patient Instructions (Addendum)
Shoulder - do the door frame stretch for 30 seconds 2-3 times a day   Voltaren Gel - topical anti-inflammatory medication   #Back pain - take the steroids   Please call the location of your choice from the menu below to schedule your Mammogram and/or Bone Density appointment.    [place]  [facility]   Phone:  [phone]   [address]                                                                            [place], [place] [address]                                            Services: 3D Mammogram and Bone Density

## 2020-03-29 NOTE — Assessment & Plan Note (Signed)
Encouraged healthy diet/exercise. Labs today

## 2020-03-29 NOTE — Assessment & Plan Note (Signed)
BP usually runs low per patient. Occasional dizziness. Encouraged hydration and will continue to monitor. Blood work today

## 2020-03-29 NOTE — Assessment & Plan Note (Signed)
Persistent leg symptoms. Did get better with steroids but symptoms returned. Will try another course and pt now has more time for PT. New referral as issues getting into her previous provided - she will call to see if she can get an appointment. If not improving with PT - recommend ortho referral

## 2020-03-29 NOTE — Assessment & Plan Note (Signed)
Suspect pectoralis muscle tightness and posture related pain. Pectoralis muscles stretch reviewed. PT referral

## 2020-03-30 ENCOUNTER — Telehealth: Payer: Self-pay

## 2020-03-30 NOTE — Telephone Encounter (Signed)
Spoke to pt and relayed lab results, per Dr. Selena Batten.

## 2020-06-01 ENCOUNTER — Telehealth: Payer: Self-pay | Admitting: *Deleted

## 2020-06-01 DIAGNOSIS — G4700 Insomnia, unspecified: Secondary | ICD-10-CM

## 2020-06-01 MED ORDER — TRAZODONE HCL 50 MG PO TABS: 25.0000 mg | ORAL_TABLET | Freq: Every evening | ORAL | 3 refills | Status: DC | PRN

## 2020-06-01 NOTE — Telephone Encounter (Signed)
Patient called requesting something to help her sleep like Ambien.. Patient stated that she has taken Ambien previously and that worked. Patient stated that she has been taking Benadryl nightly and she knows that is not good for her.Patient stated that Dr. Selena Batten is aware of the problem she has sleeping at night. Pharmacy [facility]

## 2020-06-01 NOTE — Telephone Encounter (Signed)
Would recommend melatonin 1-2 hours before bed if she has not taken that.   I would actually prefer benadryl over ambien if benadryl is working as it is less habit forming.   If she has already tried melatonin and benadryl is not working, I've sent in trazodone to try.   Would recommend follow-up visit next week if trazodone in effective.

## 2020-06-02 NOTE — Telephone Encounter (Signed)
Spoke to pt and relayed information, per Dr. Selena Batten. Pt states that she has tried melatonin in the past and it didn't work great for her. She picked up the trazadone and tried a 1/2 of one last night and she slept great. Pt states that she will follow-up with a phone call with [place] in about a week.

## 2020-09-09 IMAGING — DX DG ABDOMEN 1V
2 series · 2 of 2 positions shown · non-contrast
Comparison: Chest and abdominal radiograph 06/04/2015

CLINICAL DATA: Possible foreign body ingestion.

EXAM:
CHEST  1 VIEW; ABDOMEN - 1 VIEW

[abdomen supine (1 of 2)]
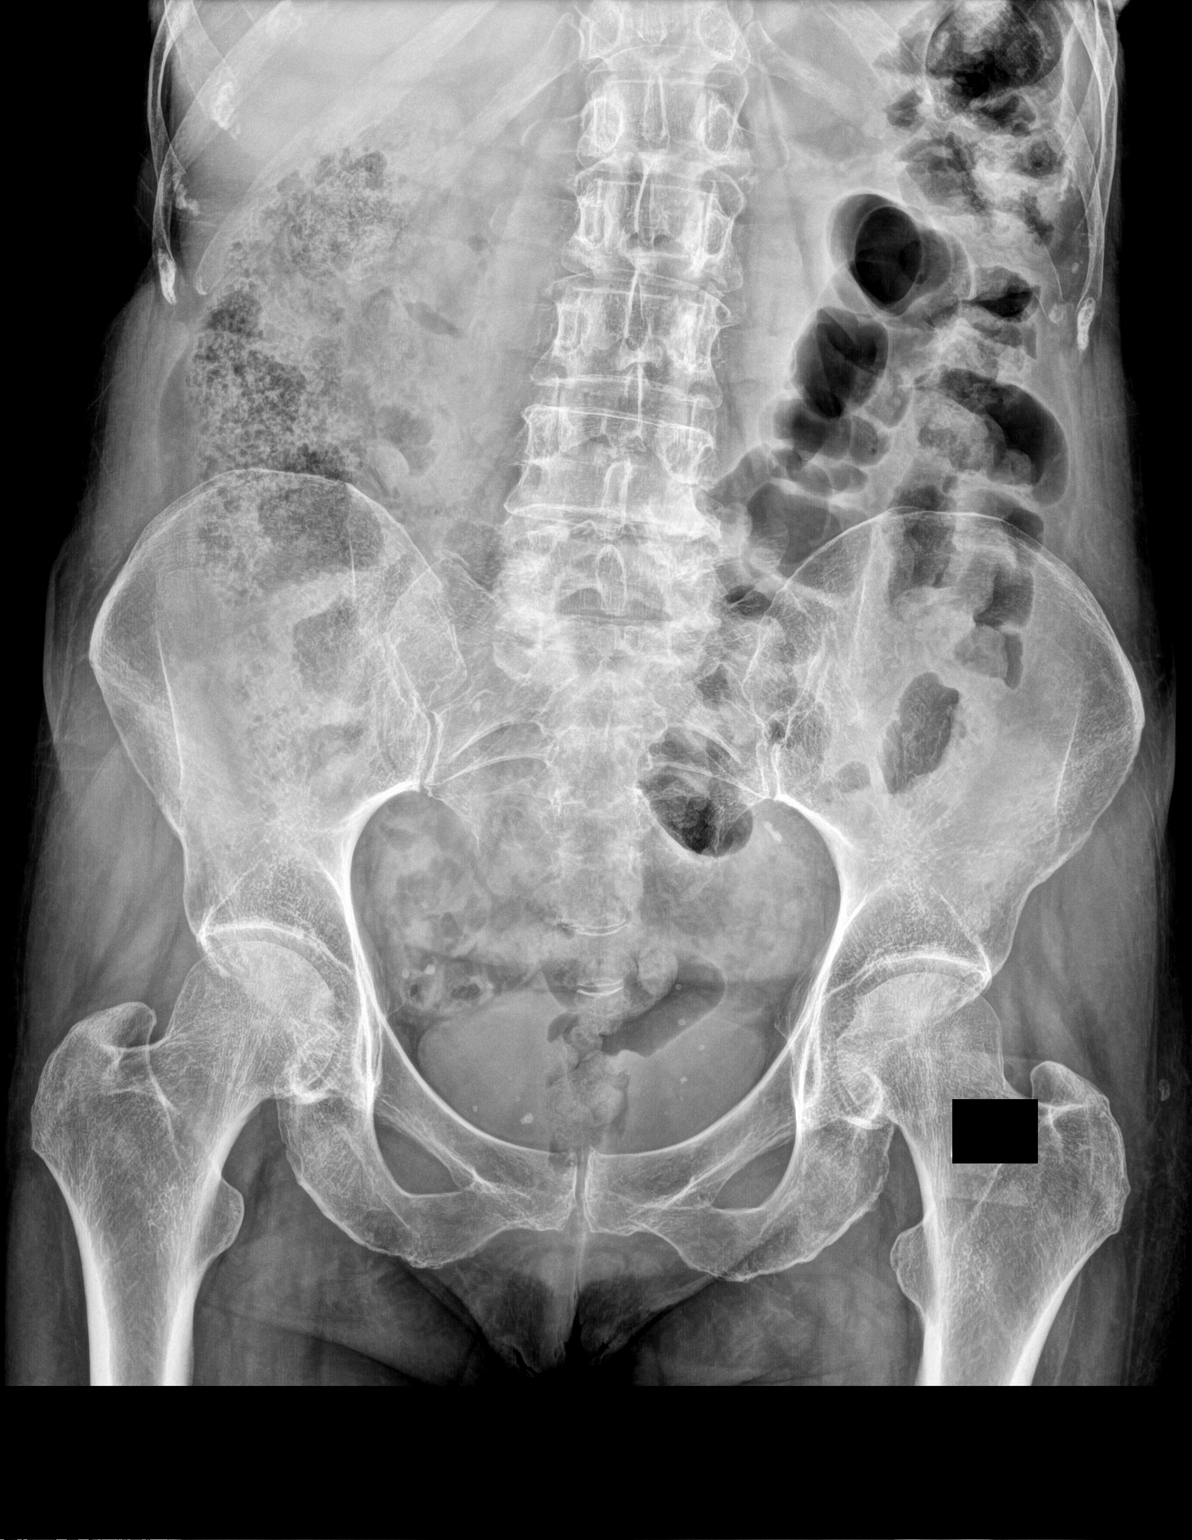

[abdomen supine (2 of 2)]
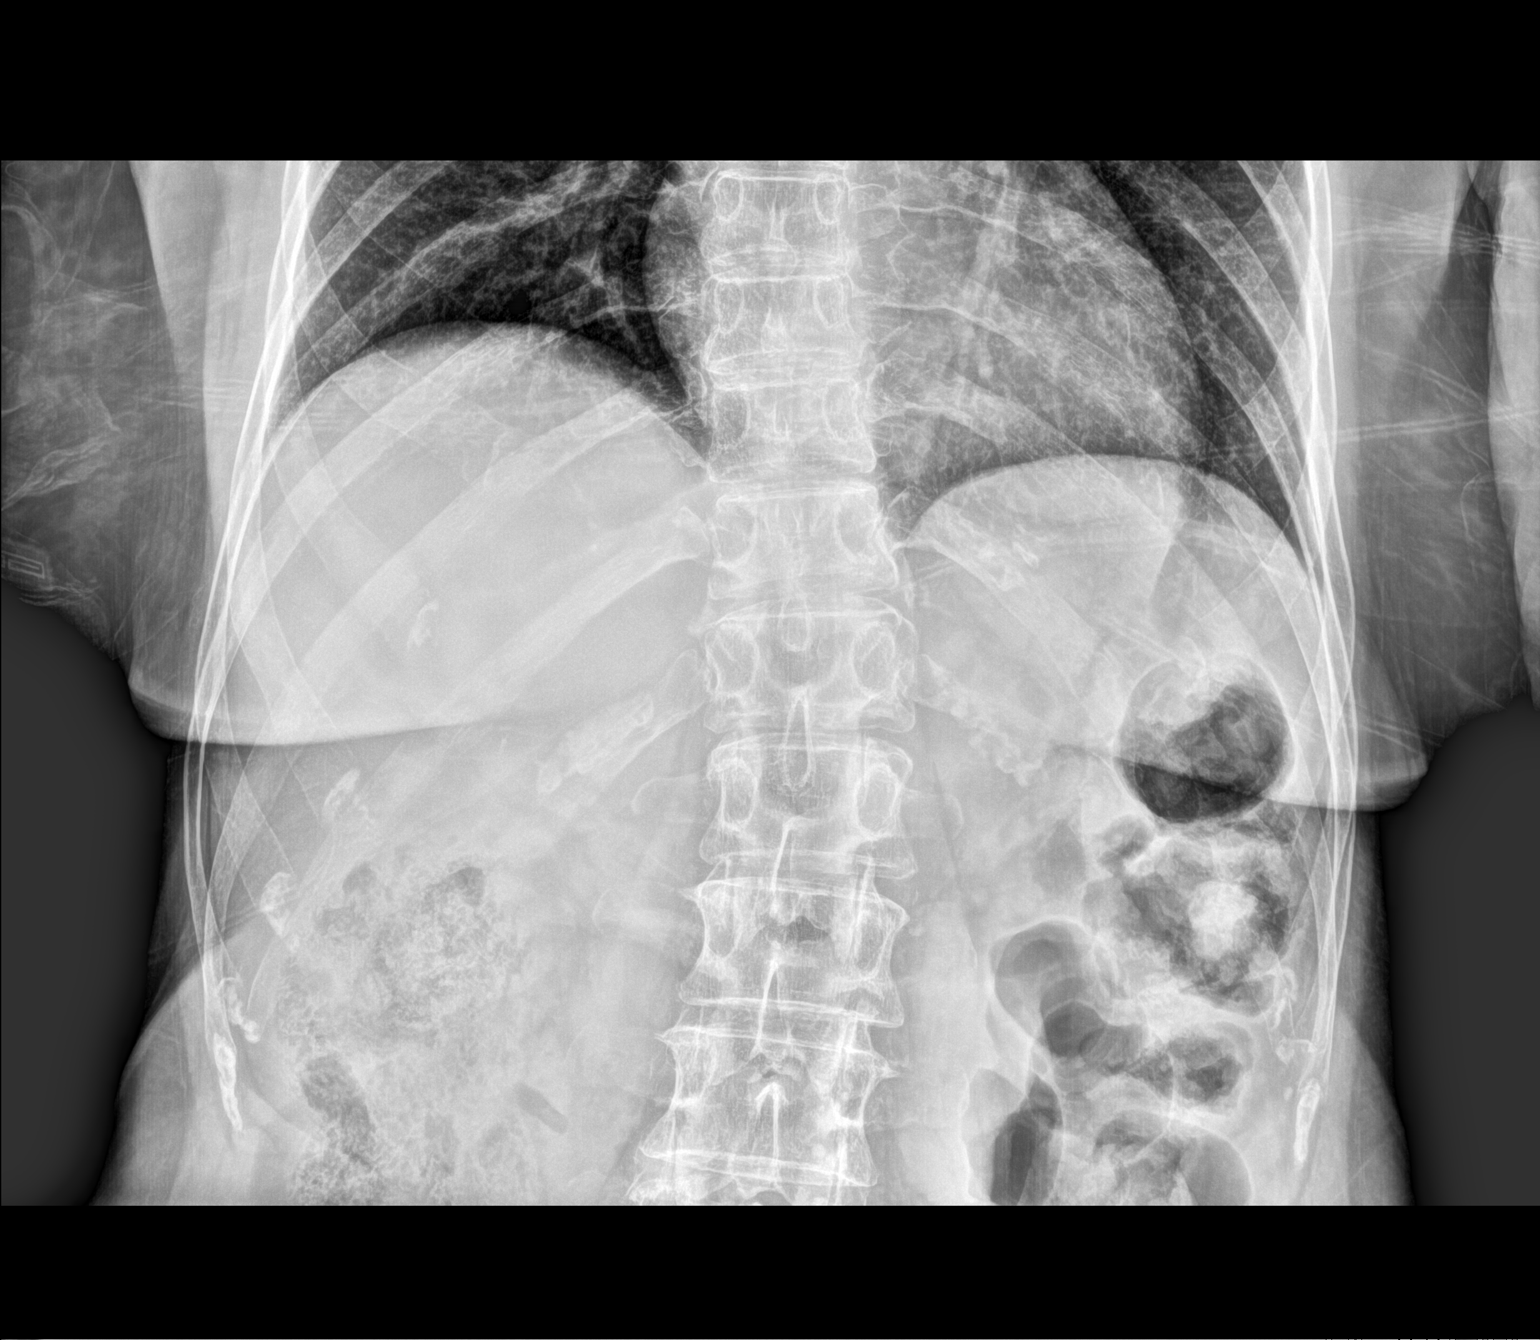

[2 of 2 positions shown; findings below may reference images not displayed]

FINDINGS: No new or unexpected radiopaque foreign body identified in the
chest, abdomen or pelvis.

The heart size and mediastinal contours are within normal limits.
The lungs are clear. The visualized skeletal structures are
unremarkable.

Nonobstructive bowel gas pattern. A few tiny radiopaque densities in
the pelvis likely represent vascular phleboliths.
IMPRESSION: No new or unexpected radiopaque foreign body identified.

## 2020-12-06 ENCOUNTER — Ambulatory Visit (INDEPENDENT_AMBULATORY_CARE_PROVIDER_SITE_OTHER): Payer: Medicare HMO | Admitting: Family Medicine

## 2020-12-06 ENCOUNTER — Other Ambulatory Visit: Payer: Self-pay

## 2020-12-06 VITALS — BP 90/58 | HR 94 | Temp 98.6°F | Ht 63.0 in | Wt 118.0 lb

## 2020-12-06 DIAGNOSIS — G8929 Other chronic pain: Secondary | ICD-10-CM

## 2020-12-06 DIAGNOSIS — G4700 Insomnia, unspecified: Secondary | ICD-10-CM

## 2020-12-06 DIAGNOSIS — F3289 Other specified depressive episodes: Secondary | ICD-10-CM | POA: Diagnosis not present

## 2020-12-06 DIAGNOSIS — M5441 Lumbago with sciatica, right side: Secondary | ICD-10-CM

## 2020-12-06 DIAGNOSIS — M5442 Lumbago with sciatica, left side: Secondary | ICD-10-CM

## 2020-12-06 MED ORDER — TRAZODONE HCL 50 MG PO TABS: 50.0000 mg | ORAL_TABLET | Freq: Every evening | ORAL | 3 refills | Status: DC | PRN

## 2020-12-06 MED ORDER — ESCITALOPRAM OXALATE 20 MG PO TABS: 20.0000 mg | ORAL_TABLET | Freq: Every day | ORAL | 3 refills | Status: DC | PRN

## 2020-12-06 MED ORDER — PREDNISONE 20 MG PO TABS: ORAL_TABLET | ORAL | 0 refills | Status: AC

## 2020-12-06 NOTE — Patient Instructions (Signed)
#  Back pain - referral - prednisone 9 day taper - do not take ibuprofen or aleve with prednisone  #Referral I have placed a referral to a specialist for you. You should receive a phone call from the specialty office. Make sure your voicemail is not full and that if you are able to answer your phone to unknown or new numbers.   It may take up to 2 weeks to hear about the referral. If you do not hear anything in 2 weeks, please call our office and ask to speak with the referral coordinator.   #Anxiety - start taking trazodone at night for sleep Week 1: lexapro every other day Week 2: lexapro daily

## 2020-12-06 NOTE — Assessment & Plan Note (Signed)
Trazodone refill to help with sleep at night. Cont trazodone 50 mg prn or nightly

## 2020-12-06 NOTE — Assessment & Plan Note (Addendum)
Recurrent issue. She tried PT w/o improvement. Will do a course of steroids to see if that provides relief as it has been 8 months since last severe flare. Discussed referral to ortho as previously had injections w/ possible improvement. Suspect cp is related to stress as she indicates.

## 2020-12-06 NOTE — Assessment & Plan Note (Signed)
More stress at home with 68 yo grandson. Discussed increasing lexapro to daily use. Start lexapro 20 mg from prn use. Consider family counseling with grandson.

## 2020-12-06 NOTE — Progress Notes (Signed)
Subjective:     Mercedes Mcintosh is a 70 y.o. 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 BP low   Social History   Tobacco Use  Smoking Status Former Smoker  . Packs/day: 0.10  . Years: 8.00  . Pack years: 0.80  . Quit date: 2012  . Years since quitting: 10.2  Smokeless Tobacco Never Used        Objective:    BP Readings from Last 3 Encounters:  12/06/20 (!) 90/58  03/29/20 (!) 80/58  02/04/20 (!) 90/52   Wt Readings from Last 3 Encounters:  12/06/20 118 lb (53.5 kg)  03/29/20 116 lb 4 oz (52.7 kg)  02/04/20 114 lb 8 oz (51.9 kg)    BP (!) 90/58   Pulse 94   Temp 98.6 F (37 C) (Temporal)   Ht 5\' 3"  (1.6 m)   Wt 118 lb (53.5 kg)   SpO2 97%   BMI 20.90 kg/m    Physical Exam Constitutional:      General: She is not in acute distress.    Appearance: She is well-developed. She is not diaphoretic.  HENT:     Right Ear: External ear normal.     Left Ear: External ear normal.  Eyes:     Conjunctiva/sclera: Conjunctivae normal.  Cardiovascular:     Rate and Rhythm: Normal rate and regular rhythm.     Heart sounds: No murmur heard.   Pulmonary:     Effort: Pulmonary effort is normal. No respiratory distress.     Breath sounds: Normal  breath sounds. No wheezing.  Musculoskeletal:     Cervical back: Neck supple.     Comments: No spinous process ttp Neg right straight leg raise Positive left straight leg raise  Skin:    General: Skin is warm and dry.     Capillary Refill: Capillary refill takes less than 2 seconds.  Neurological:     Mental Status: She is alert. Mental status is at baseline.  Psychiatric:        Mood and Affect: Mood normal.        Behavior: Behavior normal.           Assessment & Plan:   Problem List Items Addressed This Visit      Nervous and Auditory   Chronic bilateral low back pain with bilateral sciatica - Primary    Recurrent issue. She tried PT w/o improvement. Will do a course of steroids to see if that provides relief as it has been 8 months since last severe flare. Discussed referral to ortho as previously had injections w/  possible improvement. Suspect cp is related to stress as she indicates.       Relevant Medications   escitalopram (LEXAPRO) 20 MG tablet   traZODone (DESYREL) 50 MG tablet   predniSONE (DELTASONE) 20 MG tablet   Other Relevant Orders   Ambulatory referral to Orthopedic Surgery     Other   Depression    More stress at home with 54 yo grandson. Discussed increasing lexapro to daily use. Start lexapro 20 mg from prn use. Consider family counseling with grandson.       Relevant Medications   escitalopram (LEXAPRO) 20 MG tablet   traZODone (DESYREL) 50 MG tablet   Insomnia    Trazodone refill to help with sleep at night. Cont trazodone 50 mg prn or nightly      Relevant Medications   traZODone (DESYREL) 50 MG tablet       Return in about 2 months (around 02/05/2021) for sleep and anxiety.  Lynnda Child, MD  This visit occurred during the SARS-CoV-2 public health emergency.  Safety protocols were in place, including screening questions prior to the visit, additional usage of staff PPE, and extensive cleaning of exam room while observing appropriate  contact time as indicated for disinfecting solutions.

## 2020-12-28 DIAGNOSIS — M545 Low back pain, unspecified: Secondary | ICD-10-CM | POA: Diagnosis not present

## 2021-01-06 DIAGNOSIS — H524 Presbyopia: Secondary | ICD-10-CM | POA: Diagnosis not present

## 2021-05-09 ENCOUNTER — Other Ambulatory Visit: Payer: Self-pay

## 2021-05-09 ENCOUNTER — Ambulatory Visit (INDEPENDENT_AMBULATORY_CARE_PROVIDER_SITE_OTHER): Payer: Medicare HMO | Admitting: Family Medicine

## 2021-05-09 VITALS — BP 90/60 | HR 65 | Temp 98.2°F | Ht 63.0 in | Wt 116.5 lb

## 2021-05-09 DIAGNOSIS — M5441 Lumbago with sciatica, right side: Secondary | ICD-10-CM

## 2021-05-09 DIAGNOSIS — M5442 Lumbago with sciatica, left side: Secondary | ICD-10-CM | POA: Diagnosis not present

## 2021-05-09 DIAGNOSIS — G8929 Other chronic pain: Secondary | ICD-10-CM

## 2021-05-09 DIAGNOSIS — F32A Depression, unspecified: Secondary | ICD-10-CM | POA: Diagnosis not present

## 2021-05-09 DIAGNOSIS — F3289 Other specified depressive episodes: Secondary | ICD-10-CM | POA: Diagnosis not present

## 2021-05-09 DIAGNOSIS — F330 Major depressive disorder, recurrent, mild: Secondary | ICD-10-CM

## 2021-05-09 MED ORDER — ESCITALOPRAM OXALATE 10 MG PO TABS: 10.0000 mg | ORAL_TABLET | Freq: Every day | ORAL | 0 refills | Status: DC

## 2021-05-09 MED ORDER — PREDNISONE 20 MG PO TABS: ORAL_TABLET | ORAL | 0 refills | Status: AC

## 2021-05-09 NOTE — Patient Instructions (Addendum)
#  Depression - take Lexapro 10 mg EVERY DAY - Call to update in 1 month if wanting to try a higher dose - return in 2 months for depression check in  #Back pain - steroids - referral to neurosurgery  #Referral I have placed a referral to a specialist for you. You should receive a phone call from the specialty office. Make sure your voicemail is not full and that if you are able to answer your phone to unknown or new numbers.   It may take up to 2 weeks to hear about the referral. If you do not hear anything in 2 weeks, please call our office and ask to speak with the referral coordinator.

## 2021-05-09 NOTE — Assessment & Plan Note (Signed)
Restart lexapro. Advised importance of taking daily (previously pt only took prn). Will start with 10 mg and pt will call in 1 month if needing to increase. Suspect if taking daily she will not need higher dose.

## 2021-05-09 NOTE — Assessment & Plan Note (Signed)
Recurrent symptoms. Unable to illicit leg symptoms today, but previously present and with predominate leg symptoms will treat with trial of steroids (last given April) will also refer to neurosurgery given recurrent for consideration for injection due to limit response to oral steroids.

## 2021-05-09 NOTE — Progress Notes (Signed)
Subjective:     Mercedes Mcintosh is a 70 y.o. female presenting for Sciatica (Lumbar pain and down both legs, but more in R leg )     HPI  #Sciatic pain - taking tylenol and aleve - lidocaine pads to help at night  - more at night - the steroids worked in the past with relief for 1 month - endorses symptoms since May - some nights worse than others - sometimes the left leg, but mostly the right leg - endorses LBP - endorses some weakness   #depression - mood has been down - hard since losing her husband - has dated some - but this has been hard - has been having more family troubles - has not spoken with son for months - has been out   Review of Systems  Gastrointestinal:        No loss of bowel control  Genitourinary:        No loss of bladder control  Neurological:  Positive for dizziness.    Social History   Tobacco Use  Smoking Status Former   Packs/day: 0.10   Years: 8.00   Pack years: 0.80   Types: Cigarettes   Quit date: 2012   Years since quitting: 10.6  Smokeless Tobacco Never        Objective:    BP Readings from Last 3 Encounters:  05/09/21 90/60  12/06/20 (!) 90/58  03/29/20 (!) 80/58   Wt Readings from Last 3 Encounters:  05/09/21 116 lb 8 oz (52.8 kg)  12/06/20 118 lb (53.5 kg)  03/29/20 116 lb 4 oz (52.7 kg)    BP 90/60   Pulse 65   Temp 98.2 F (36.8 C) (Temporal)   Ht 5\' 3"  (1.6 m)   Wt 116 lb 8 oz (52.8 kg)   SpO2 97%   BMI 20.64 kg/m    Physical Exam Constitutional:      General: She is not in acute distress.    Appearance: She is well-developed. She is not diaphoretic.  HENT:     Right Ear: External ear normal.     Left Ear: External ear normal.     Nose: Nose normal.  Eyes:     Conjunctiva/sclera: Conjunctivae normal.  Cardiovascular:     Rate and Rhythm: Normal rate.  Pulmonary:     Effort: Pulmonary effort is normal.  Musculoskeletal:     Cervical back: Neck supple.     Comments: Back Inspection: no  abnormality Palpation: deferred ROM: deferred Strength: normal b/l Straight leg raise - negative Reflexes: normal  Skin:    General: Skin is warm and dry.     Capillary Refill: Capillary refill takes less than 2 seconds.  Neurological:     Mental Status: She is alert. Mental status is at baseline.  Psychiatric:        Mood and Affect: Mood normal.        Behavior: Behavior normal.     Depression screen [facility] 2/9 05/09/2021 03/29/2020  Decreased Interest 3 1  Down, Depressed, Hopeless 1 2  PHQ - 2 Score 4 3  Altered sleeping 1 1  Tired, decreased energy 3 1  Change in appetite 1 1  Feeling bad or failure about yourself  0 0  Trouble concentrating 1 2  Moving slowly or fidgety/restless 1 1  Suicidal thoughts 0 0  PHQ-9 Score 11 9  Difficult doing work/chores Somewhat difficult Somewhat difficult        Assessment &  Plan:   Problem List Items Addressed This Visit       Nervous and Auditory   Chronic bilateral low back pain with bilateral sciatica - Primary    Recurrent symptoms. Unable to illicit leg symptoms today, but previously present and with predominate leg symptoms will treat with trial of steroids (last given April) will also refer to neurosurgery given recurrent for consideration for injection due to limit response to oral steroids.       Relevant Medications   escitalopram (LEXAPRO) 10 MG tablet   predniSONE (DELTASONE) 20 MG tablet   Other Relevant Orders   Ambulatory referral to Neurosurgery     Other   Major depressive disorder, recurrent (HCC)    Restart lexapro. Advised importance of taking daily (previously pt only took prn). Will start with 10 mg and pt will call in 1 month if needing to increase. Suspect if taking daily she will not need higher dose.       Relevant Medications   escitalopram (LEXAPRO) 10 MG tablet     Return in about 2 months (around 07/09/2021).  Lynnda Child, MD  This visit occurred during the SARS-CoV-2 public health  emergency.  Safety protocols were in place, including screening questions prior to the visit, additional usage of staff PPE, and extensive cleaning of exam room while observing appropriate contact time as indicated for disinfecting solutions.

## 2021-05-17 ENCOUNTER — Other Ambulatory Visit: Payer: Self-pay | Admitting: Family Medicine

## 2021-05-17 DIAGNOSIS — F3289 Other specified depressive episodes: Secondary | ICD-10-CM

## 2021-05-30 DIAGNOSIS — M5416 Radiculopathy, lumbar region: Secondary | ICD-10-CM | POA: Diagnosis not present

## 2021-06-02 ENCOUNTER — Emergency Department: Payer: Medicare HMO

## 2021-06-02 ENCOUNTER — Emergency Department
Admission: EM | Admit: 2021-06-02 | Discharge: 2021-06-02 | Disposition: A | Discharge status: Home / Self Care | Payer: Medicare HMO | Attending: Emergency Medicine | Admitting: Emergency Medicine

## 2021-06-02 ENCOUNTER — Other Ambulatory Visit: Payer: Self-pay

## 2021-06-02 DIAGNOSIS — M546 Pain in thoracic spine: Secondary | ICD-10-CM | POA: Diagnosis not present

## 2021-06-02 DIAGNOSIS — S0990XA Unspecified injury of head, initial encounter: Secondary | ICD-10-CM | POA: Diagnosis not present

## 2021-06-02 DIAGNOSIS — W19XXXA Unspecified fall, initial encounter: Secondary | ICD-10-CM

## 2021-06-02 DIAGNOSIS — Z043 Encounter for examination and observation following other accident: Secondary | ICD-10-CM | POA: Diagnosis not present

## 2021-06-02 DIAGNOSIS — W01198A Fall on same level from slipping, tripping and stumbling with subsequent striking against other object, initial encounter: Secondary | ICD-10-CM | POA: Diagnosis not present

## 2021-06-02 DIAGNOSIS — Z87891 Personal history of nicotine dependence: Secondary | ICD-10-CM | POA: Diagnosis not present

## 2021-06-02 DIAGNOSIS — M4312 Spondylolisthesis, cervical region: Secondary | ICD-10-CM | POA: Diagnosis not present

## 2021-06-02 DIAGNOSIS — M47812 Spondylosis without myelopathy or radiculopathy, cervical region: Secondary | ICD-10-CM | POA: Diagnosis not present

## 2021-06-02 DIAGNOSIS — M25531 Pain in right wrist: Secondary | ICD-10-CM | POA: Diagnosis not present

## 2021-06-02 DIAGNOSIS — M542 Cervicalgia: Secondary | ICD-10-CM | POA: Insufficient documentation

## 2021-06-02 DIAGNOSIS — M7918 Myalgia, other site: Secondary | ICD-10-CM

## 2021-06-02 DIAGNOSIS — E042 Nontoxic multinodular goiter: Secondary | ICD-10-CM | POA: Diagnosis not present

## 2021-06-02 DIAGNOSIS — Y92513 Shop (commercial) as the place of occurrence of the external cause: Secondary | ICD-10-CM | POA: Insufficient documentation

## 2021-06-02 MED ORDER — OXYCODONE-ACETAMINOPHEN 5-325 MG PO TABS
1.0000 | ORAL_TABLET | Freq: Once | ORAL | Status: AC
  Administered 2021-06-02: 1 via ORAL
  Filled 2021-06-02: qty 1

## 2021-06-02 MED ORDER — OXYCODONE-ACETAMINOPHEN 5-325 MG PO TABS: 1.0000 | ORAL_TABLET | Freq: Four times a day (QID) | ORAL | 0 refills | Status: DC | PRN

## 2021-06-02 NOTE — ED Notes (Signed)
Pt on phone to secure ride home, which she does. Sister will pick pt up

## 2021-06-02 NOTE — Discharge Instructions (Addendum)
No acute findings on CT of the head, cervical spine, or thoracic spine.  Read and follow discharge care instructions.  Take medication as directed.  Follow-up with PCP.

## 2021-06-02 NOTE — ED Provider Notes (Signed)
[facility] Emergency Department Provider Note   ____________________________________________   Event Date/Time   First MD Initiated Contact with Patient 06/02/21 1155     (approximate)  I have reviewed the triage vital signs and the nursing notes.   HISTORY  Chief Complaint Fall    HPI Mercedes Mcintosh is a 70 y.o. female patient complaining of head, neck, and upper back pain secondary to a slip and fall which occurred yesterday at a beauty salon.  Patient denies LOC but states she was dazed for about 3 to 4 minutes.  Patient denies vertigo or vision disturbance.  Patient states she had a restless night secondary to headache, neck pain, and upper back pain.  Patient also complain of pain and edema to the right wrist secondary to the fall.  Patient denies loss sensation states pain increased with flexion and extension of the wrist.  Patient states she does not take blood thinners.  Patient denies abdominal/pelvic or lower extremity pain.  Rates her pain as 8/10.  Described pain as "achy".  No palliative measure for complaint.         History reviewed. No pertinent past medical history.  Patient Active Problem List   Diagnosis Date Noted   Insomnia 12/06/2020   Chronic right shoulder pain 03/29/2020   Hypotension 03/29/2020   Headache 02/04/2020   Chronic bilateral low back pain with bilateral sciatica 02/04/2020   Major depressive disorder, recurrent (HCC) 04/11/2015   Prediabetes 11/15/2010    Past Surgical History:  Procedure Laterality Date   ABDOMINAL HYSTERECTOMY      Prior to Admission medications   Medication Sig Start Date End Date Taking? Authorizing Provider  oxyCODONE-acetaminophen (PERCOCET) 5-325 MG tablet Take 1 tablet by mouth every 6 (six) hours as needed for severe pain. 06/02/21 06/02/22 Yes Joni Reining, PA-C  aspirin-acetaminophen-caffeine (EXCEDRIN MIGRAINE) [phone] MG tablet Take 1 tablet by mouth every 6 (six) hours  as needed for headache. 06/27/19   Cuthriell, Delorise Royals, PA-C  escitalopram (LEXAPRO) 10 MG tablet Take 1 tablet (10 mg total) by mouth daily. 05/09/21   Lynnda Child, MD  Ibuprofen-Acetaminophen (ADVIL DUAL ACTION) 125-250 MG TABS Take 2 tablets by mouth every 6 (six) hours as needed.    [provider]  traZODone (DESYREL) 50 MG tablet Take 1 tablet (50 mg total) by mouth at bedtime as needed for sleep. 12/06/20   Lynnda Child, MD    Allergies Metronidazole  Family History  Problem Relation Age of Onset   Breast cancer Paternal Aunt    Bursitis Mother    Arthritis Mother    Skin cancer Father    Lung cancer Father        nonsmoker   Lung cancer Paternal Grandfather        nonsmoker    Social History Social History   Tobacco Use   Smoking status: Former    Packs/day: 0.10    Years: 8.00    Pack years: 0.80    Types: Cigarettes    Quit date: 2012    Years since quitting: 10.7   Smokeless tobacco: Never  Vaping Use   Vaping Use: Never used  Substance Use Topics   Alcohol use: Yes    Comment: twice a month, one drink   Drug use: No    Review of Systems  Constitutional: No fever/chills Eyes: No visual changes. ENT: No sore throat. Cardiovascular: Denies chest pain. Respiratory: Denies shortness of breath. Gastrointestinal: No abdominal pain.  No nausea, no vomiting.  No diarrhea.  No constipation. Genitourinary: Negative for dysuria. Musculoskeletal: Neck pain, back pain, right wrist pain.   Skin: Negative for rash. Neurological: Positive for headaches, but denies focal weakness or numbness. Psychiatric: Depression Allergic/Immunilogical: Flagyl PHYSICAL EXAM:  VITAL SIGNS: ED Triage Vitals  Enc Vitals Group     BP 06/02/21 1133 (!) 111/53     Pulse Rate 06/02/21 1133 66     Resp 06/02/21 1133 18     Temp 06/02/21 1133 98.7 F (37.1 C)     Temp Source 06/02/21 1133 Oral     SpO2 06/02/21 1133 97 %     Weight --      Height --      Head  Circumference --      Peak Flow --      Pain Score 06/02/21 1038 8     Pain Loc --      Pain Edu? --      Excl. in GC? --     Constitutional: Alert and oriented. Well appearing and in no acute distress. Eyes: Conjunctivae are normal. PERRL. EOMI. Head: Atraumatic. Nose: No congestion/rhinnorhea. Mouth/Throat: Mucous membranes are moist.  Oropharynx non-erythematous. Neck: No stridor.  No cervical spine tenderness to palpation.  Decreased range of motion with flexion. Hematological/Lymphatic/Immunilogical: No cervical lymphadenopathy. Cardiovascular: Normal rate, regular rhythm. Grossly normal heart sounds.  Good peripheral circulation. Respiratory: Normal respiratory effort.  No retractions. Lungs CTAB. Gastrointestinal: Soft and nontender. No distention. No abdominal bruits. No CVA tenderness. Genitourinary: Deferred Musculoskeletal: No obvious deformity to the cervical or thoracic spine spine.  No obvious right wrist deformity or edema.   Neurologic:  Normal speech and language. No gross focal neurologic deficits are appreciated. No gait instability. Skin:  Skin is warm, dry and intact. No rash noted.  No abrasion or ecchymosis. Psychiatric: Mood and affect are normal. Speech and behavior are normal.  ____________________________________________   LABS (all labs ordered are listed, but only abnormal results are displayed)  Labs Reviewed - No data to display ____________________________________________  EKG   ____________________________________________  RADIOLOGY I, Joni Reining, personally viewed and evaluated these images (plain radiographs) as part of my medical decision making, as well as reviewing the written report by the radiologist.  ED MD interpretation: No acute findings on x-ray of the right wrist.  Official radiology report(s): CT HEAD WO CONTRAST ( )  Result Date: 06/02/2021 CLINICAL DATA:  Fall, head injury EXAM: CT HEAD WITHOUT CONTRAST CT CERVICAL  SPINE WITHOUT CONTRAST TECHNIQUE: Multidetector CT imaging of the head and cervical spine was performed following the standard protocol without intravenous contrast. Multiplanar CT image reconstructions of the cervical spine were also generated. COMPARISON:  None. FINDINGS: CT HEAD FINDINGS Brain: Ventricle size and cerebral volume within normal limits. Patchy white matter hypodensity bilaterally. Negative for acute infarct, hemorrhage, mass. Vascular: Negative for hyperdense vessel Skull: Negative Sinuses/Orbits: Mucosal edema paranasal sinuses.  Negative orbit Other: None CT CERVICAL SPINE FINDINGS Alignment: 2 mm retrolisthesis C3-4.  Slight retrolisthesis C6-7 Skull base and vertebrae: Negative for fracture Soft tissues and spinal canal: No acute abnormality. Multiple small nodules in the thyroid the largest is 6 mm on the left. Disc levels: Advanced disc degeneration at C3-4 with marked disc space narrowing and extensive bony sclerosis. Associated uncinate spurring with mild foraminal narrowing bilaterally. Mild disc degeneration C5-6. Moderate disc degeneration and spurring at C6-7. Upper chest: Lung apices clear bilaterally. Other: None IMPRESSION: 1. No acute intracranial abnormality. Moderate white matter  changes consistent with chronic microvascular ischemia 2. Negative for cervical spine fracture. Cervical spondylosis most prominent at C3-4 and C6-7. Electronically Signed   By: Marlan Palau M.D.   On: 06/02/2021 12:55   CT Cervical Spine Wo Contrast  Result Date: 06/02/2021 CLINICAL DATA:  Fall, head injury EXAM: CT HEAD WITHOUT CONTRAST CT CERVICAL SPINE WITHOUT CONTRAST TECHNIQUE: Multidetector CT imaging of the head and cervical spine was performed following the standard protocol without intravenous contrast. Multiplanar CT image reconstructions of the cervical spine were also generated. COMPARISON:  None. FINDINGS: CT HEAD FINDINGS Brain: Ventricle size and cerebral volume within normal limits.  Patchy white matter hypodensity bilaterally. Negative for acute infarct, hemorrhage, mass. Vascular: Negative for hyperdense vessel Skull: Negative Sinuses/Orbits: Mucosal edema paranasal sinuses.  Negative orbit Other: None CT CERVICAL SPINE FINDINGS Alignment: 2 mm retrolisthesis C3-4.  Slight retrolisthesis C6-7 Skull base and vertebrae: Negative for fracture Soft tissues and spinal canal: No acute abnormality. Multiple small nodules in the thyroid the largest is 6 mm on the left. Disc levels: Advanced disc degeneration at C3-4 with marked disc space narrowing and extensive bony sclerosis. Associated uncinate spurring with mild foraminal narrowing bilaterally. Mild disc degeneration C5-6. Moderate disc degeneration and spurring at C6-7. Upper chest: Lung apices clear bilaterally. Other: None IMPRESSION: 1. No acute intracranial abnormality. Moderate white matter changes consistent with chronic microvascular ischemia 2. Negative for cervical spine fracture. Cervical spondylosis most prominent at C3-4 and C6-7. Electronically Signed   By: Marlan Palau M.D.   On: 06/02/2021 12:55   CT Thoracic Spine Wo Contrast  Result Date: 06/02/2021 CLINICAL DATA:  Fall.  Back pain EXAM: CT THORACIC SPINE WITHOUT CONTRAST TECHNIQUE: Multidetector CT images of the thoracic were obtained using the standard protocol without intravenous contrast. COMPARISON:  None. FINDINGS: Alignment: Normal Vertebrae: Negative for fracture Paraspinal and other soft tissues: Negative for paraspinous mass or edema. Lungs are clear. Disc levels: No significant disc space narrowing or spurring. Mild facet degeneration. IMPRESSION: Negative for thoracic fracture. No acute abnormality. Mild degenerative change. Electronically Signed   By: Marlan Palau M.D.   On: 06/02/2021 12:42    ____________________________________________   PROCEDURES  Procedure(s) performed (including Critical  Care):  Procedures   ____________________________________________   INITIAL IMPRESSION / ASSESSMENT AND PLAN / ED COURSE  As part of my medical decision making, I reviewed the following data within the electronic MEDICAL RECORD NUMBER         Patient presents with head pain, neck pain, upper back pain, and right wrist pain secondary to a slip and fall backwards yesterday at a beauty salon.  No LOC.  Discussed no acute CT findings of the head, cervical spine, and thoracic spine.  X-ray revealed no acute findings of the right wrist.  Patient complaint physical exam consistent muscle skeletal pain secondary to a slip and fall.  Patient given discharge care instruction and prescription for Percocet for 3 days.  Advised to follow-up with PCP.  ____________________________________________   FINAL CLINICAL IMPRESSION(S) / ED DIAGNOSES  Final diagnoses:  Fall, initial encounter  Musculoskeletal pain  Minor head injury, initial encounter     ED Discharge Orders          Ordered    oxyCODONE-acetaminophen (PERCOCET) 5-325 MG tablet  Every 6 hours PRN        06/02/21 1312             Note:  This document was prepared using Dragon voice recognition software and may include  unintentional dictation errors.    Joni Reining, PA-C 06/02/21 1316    Jene Every, MD 06/02/21 1320

## 2021-06-02 NOTE — ED Triage Notes (Signed)
Pt states she slipped on the cape at the beauty salon yesterday, falling back hitting the back of her head, denies LOC, pt c/o head and upper back pain. States she does Not take blood thinners

## 2021-06-22 ENCOUNTER — Ambulatory Visit: Payer: Medicare HMO | Admitting: Family Medicine

## 2021-06-22 DIAGNOSIS — M546 Pain in thoracic spine: Secondary | ICD-10-CM | POA: Insufficient documentation

## 2021-06-22 DIAGNOSIS — S29019A Strain of muscle and tendon of unspecified wall of thorax, initial encounter: Secondary | ICD-10-CM | POA: Diagnosis not present

## 2021-06-26 ENCOUNTER — Ambulatory Visit: Payer: Medicare HMO | Admitting: Family Medicine

## 2021-07-07 DIAGNOSIS — M542 Cervicalgia: Secondary | ICD-10-CM | POA: Diagnosis not present

## 2021-07-07 DIAGNOSIS — M546 Pain in thoracic spine: Secondary | ICD-10-CM | POA: Diagnosis not present

## 2021-07-07 DIAGNOSIS — M545 Low back pain, unspecified: Secondary | ICD-10-CM | POA: Diagnosis not present

## 2021-07-07 DIAGNOSIS — M5416 Radiculopathy, lumbar region: Secondary | ICD-10-CM | POA: Diagnosis not present

## 2021-07-11 DIAGNOSIS — M546 Pain in thoracic spine: Secondary | ICD-10-CM | POA: Diagnosis not present

## 2021-07-11 DIAGNOSIS — M545 Low back pain, unspecified: Secondary | ICD-10-CM | POA: Diagnosis not present

## 2021-07-11 DIAGNOSIS — M542 Cervicalgia: Secondary | ICD-10-CM | POA: Diagnosis not present

## 2021-07-11 DIAGNOSIS — M5416 Radiculopathy, lumbar region: Secondary | ICD-10-CM | POA: Diagnosis not present

## 2021-07-17 ENCOUNTER — Other Ambulatory Visit: Payer: Self-pay | Admitting: Family Medicine

## 2021-07-17 ENCOUNTER — Telehealth: Payer: Self-pay | Admitting: Family Medicine

## 2021-07-17 DIAGNOSIS — Z1231 Encounter for screening mammogram for malignant neoplasm of breast: Secondary | ICD-10-CM

## 2021-07-17 DIAGNOSIS — G4700 Insomnia, unspecified: Secondary | ICD-10-CM

## 2021-07-17 DIAGNOSIS — E2839 Other primary ovarian failure: Secondary | ICD-10-CM

## 2021-07-17 NOTE — Telephone Encounter (Signed)
  Encourage patient to contact the pharmacy for refills or they can request refills through [facility]  LAST APPOINTMENT DATE:  Please schedule appointment if longer than 1 year  NEXT APPOINTMENT DATE:no future appt  MEDICATION:traZODone (DESYREL) 50 MG tablet,oxyCODONE-acetaminophen (PERCOCET) 5-325 MG tablet,aspirin-acetaminophen-caffeine (EXCEDRIN MIGRAINE) 250-250-65 MG tablet  Is the patient out of medication? yes  PHARMACY:[id number] - [place], [place] - [address]  Let patient know to contact pharmacy at the end of the day to make sure medication is ready.  Please notify patient to allow 48-72 hours to process  CLINICAL FILLS OUT ALL BELOW:   LAST REFILL:  QTY:  REFILL DATE:    OTHER COMMENTS:    Okay for refill?  Please advise

## 2021-07-17 NOTE — Telephone Encounter (Signed)
Patient is overdue for CPE. Would recommend scheduling.    Would recommend getting mammogram updated as well. I've ordered Dexa and mammogram which she can schedule with [place]  Please call the location of your choice from the menu below to schedule your Mammogram and/or Bone Density appointment.    [facility]   Phone:  [phone]   [address]                                                                            [place], [place] [address]                                            Services: 3D Mammogram and Bone Density

## 2021-07-17 NOTE — Telephone Encounter (Signed)
Pt called stating that she would like a referral to a bone density.

## 2021-07-18 NOTE — Telephone Encounter (Signed)
LMTCB to schedule an Awv/cpe

## 2021-07-19 NOTE — Telephone Encounter (Signed)
Last OV: 05/09/21 No future visits scheduled.

## 2021-07-19 NOTE — Addendum Note (Signed)
Addended by: Erby Pian on: 07/19/2021 03:16 PM   Modules accepted: Orders

## 2021-07-19 NOTE — Telephone Encounter (Signed)
2nd Attempt - LMTCB to schedule

## 2021-07-20 MED ORDER — TRAZODONE HCL 50 MG PO TABS: 50.0000 mg | ORAL_TABLET | Freq: Every evening | ORAL | 3 refills | Status: DC | PRN

## 2021-07-20 MED ORDER — EXCEDRIN MIGRAINE 250-250-65 MG PO TABS: 1.0000 | ORAL_TABLET | Freq: Four times a day (QID) | ORAL | 0 refills | Status: DC | PRN

## 2021-07-20 NOTE — Telephone Encounter (Signed)
Refilled trazodone and migraine medication I've never prescribed opiates for her so she would need an appointment or to contact the last provider she saw who prescribed it

## 2021-07-20 NOTE — Addendum Note (Signed)
Addended by: Gweneth Dimitri R on: 07/20/2021 08:00 AM   Modules accepted: Orders

## 2021-07-28 DIAGNOSIS — M546 Pain in thoracic spine: Secondary | ICD-10-CM | POA: Diagnosis not present

## 2021-08-02 DIAGNOSIS — M5416 Radiculopathy, lumbar region: Secondary | ICD-10-CM | POA: Diagnosis not present

## 2021-08-25 ENCOUNTER — Encounter: Payer: Self-pay | Admitting: Radiology

## 2021-08-25 ENCOUNTER — Emergency Department: Payer: Medicare HMO

## 2021-08-25 ENCOUNTER — Emergency Department
Admission: EM | Admit: 2021-08-25 | Discharge: 2021-08-25 | Disposition: A | Discharge status: Home / Self Care | Payer: Medicare HMO | Attending: Emergency Medicine | Admitting: Emergency Medicine

## 2021-08-25 ENCOUNTER — Other Ambulatory Visit: Payer: Self-pay

## 2021-08-25 DIAGNOSIS — Z7982 Long term (current) use of aspirin: Secondary | ICD-10-CM | POA: Diagnosis not present

## 2021-08-25 DIAGNOSIS — Z87891 Personal history of nicotine dependence: Secondary | ICD-10-CM | POA: Diagnosis not present

## 2021-08-25 DIAGNOSIS — M542 Cervicalgia: Secondary | ICD-10-CM | POA: Diagnosis not present

## 2021-08-25 DIAGNOSIS — R0789 Other chest pain: Secondary | ICD-10-CM | POA: Diagnosis not present

## 2021-08-25 DIAGNOSIS — M79604 Pain in right leg: Secondary | ICD-10-CM | POA: Insufficient documentation

## 2021-08-25 DIAGNOSIS — Y92410 Unspecified street and highway as the place of occurrence of the external cause: Secondary | ICD-10-CM | POA: Insufficient documentation

## 2021-08-25 DIAGNOSIS — S0990XA Unspecified injury of head, initial encounter: Secondary | ICD-10-CM | POA: Diagnosis not present

## 2021-08-25 DIAGNOSIS — R109 Unspecified abdominal pain: Secondary | ICD-10-CM | POA: Insufficient documentation

## 2021-08-25 DIAGNOSIS — R519 Headache, unspecified: Secondary | ICD-10-CM | POA: Insufficient documentation

## 2021-08-25 DIAGNOSIS — S6992XA Unspecified injury of left wrist, hand and finger(s), initial encounter: Secondary | ICD-10-CM | POA: Diagnosis not present

## 2021-08-25 DIAGNOSIS — S60222A Contusion of left hand, initial encounter: Secondary | ICD-10-CM | POA: Insufficient documentation

## 2021-08-25 DIAGNOSIS — M79605 Pain in left leg: Secondary | ICD-10-CM | POA: Insufficient documentation

## 2021-08-25 LAB — URINALYSIS, ROUTINE W REFLEX MICROSCOPIC
Bilirubin Urine: NEGATIVE
Glucose, UA: NEGATIVE mg/dL
Hgb urine dipstick: NEGATIVE
Ketones, ur: NEGATIVE mg/dL
Leukocytes,Ua: NEGATIVE
Nitrite: NEGATIVE
Protein, ur: NEGATIVE mg/dL
Specific Gravity, Urine: 1.006 (ref 1.005–1.030)
pH: 7 (ref 5.0–8.0)

## 2021-08-25 LAB — CBC
HCT: 38.9 % (ref 36.0–46.0)
Hemoglobin: 13 g/dL (ref 12.0–15.0)
MCH: 31.5 pg (ref 26.0–34.0)
MCHC: 33.4 g/dL (ref 30.0–36.0)
MCV: 94.2 fL (ref 80.0–100.0)
Platelets: 245 10*3/uL (ref 150–400)
RBC: 4.13 MIL/uL (ref 3.87–5.11)
RDW: 12.2 % (ref 11.5–15.5)
WBC: 5.8 10*3/uL (ref 4.0–10.5)
nRBC: 0 % (ref 0.0–0.2)

## 2021-08-25 LAB — BASIC METABOLIC PANEL
Anion gap: 5 (ref 5–15)
BUN: 8 mg/dL (ref 8–23)
CO2: 28 mmol/L (ref 22–32)
Calcium: 9.1 mg/dL (ref 8.9–10.3)
Chloride: 103 mmol/L (ref 98–111)
Creatinine, Ser: 0.65 mg/dL (ref 0.44–1.00)
GFR, Estimated: >60 mL/min (ref >60)
Glucose, Bld: 88 mg/dL (ref 70–99)
Potassium: 3.8 mmol/L (ref 3.5–5.1)
Sodium: 136 mmol/L (ref 135–145)

## 2021-08-25 LAB — TROPONIN I (HIGH SENSITIVITY): Troponin I (High Sensitivity): <2 ng/L (ref <18)

## 2021-08-25 MED ORDER — OXYCODONE-ACETAMINOPHEN 5-325 MG PO TABS: 1.0000 | ORAL_TABLET | Freq: Four times a day (QID) | ORAL | 0 refills | Status: AC | PRN

## 2021-08-25 MED ORDER — ONDANSETRON HCL 4 MG PO TABS: 4.0000 mg | ORAL_TABLET | Freq: Three times a day (TID) | ORAL | 0 refills | Status: AC | PRN

## 2021-08-25 MED ORDER — IOHEXOL 300 MG/ML  SOLN
100.0000 mL | Freq: Once | INTRAMUSCULAR | Status: AC | PRN
  Administered 2021-08-25: 100 mL via INTRAVENOUS
  Filled 2021-08-25: qty 100
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
## 2021-08-25 NOTE — ED Provider Notes (Signed)
  Physical Exam  BP (!) 90/44 (BP Location: Right Arm)   Pulse 64   Temp 98.3 F (36.8 C) (Oral)   Resp 18   Ht 5\' 3"  (1.6 m)   Wt 52.2 kg   SpO2 99%   BMI 20.37 kg/m   Physical Exam  ED Course/Procedures     Procedures  MDM   Assumed patient care for , NP.  Patient has nondisplaced compression fracture of the superior endplate of T3 with approximately 20% height loss.  Spoke to neurosurgeon on-call who recommended TLSO if tolerable and follow-up with him as an outpatient.  Patient placed in TLSO.  Percocet was prescribed for pain and patient was advised to follow-up with Dr. Octaviano Glow.  Return precautions were given to return with new or worsening symptoms.       Adriana Simas [place], [place] 08/25/21 2122    2123, MD 08/27/21 [phone]

## 2021-08-25 NOTE — Progress Notes (Signed)
Orthopedic Tech Progress Note Patient Details:  Mercedes Mcintosh [birth year] [id number]  Patient ID: [name], female   DOB: 28-Nov-1950, 70 y.o.   MRN: [id number] Brace ordered. Al Decant 08/25/2021, 5:45 PM

## 2021-08-25 NOTE — Discharge Instructions (Signed)
Please follow-up with neurosurgery, Dr. Adriana Simas. Please take Percocet for pain.  You can take Zofran with Percocet in order to avoid nausea.
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
## 2021-08-25 NOTE — ED Notes (Signed)
Dc ppw provided. Followup information given. Pt off unit in back brace

## 2021-09-01 DIAGNOSIS — M5416 Radiculopathy, lumbar region: Secondary | ICD-10-CM | POA: Diagnosis not present

## 2021-09-05 ENCOUNTER — Ambulatory Visit: Payer: Medicare HMO | Admitting: Family

## 2021-09-05 DIAGNOSIS — S22030A Wedge compression fracture of third thoracic vertebra, initial encounter for closed fracture: Secondary | ICD-10-CM | POA: Diagnosis not present

## 2021-09-07 ENCOUNTER — Ambulatory Visit: Payer: Medicare HMO | Admitting: Family

## 2021-09-12 ENCOUNTER — Ambulatory Visit: Payer: Medicare HMO | Admitting: Family

## 2021-09-20 ENCOUNTER — Emergency Department
Admission: EM | Admit: 2021-09-20 | Discharge: 2021-09-20 | Disposition: A | Discharge status: Home / Self Care | Payer: Medicare HMO | Attending: Emergency Medicine | Admitting: Emergency Medicine

## 2021-09-20 ENCOUNTER — Other Ambulatory Visit: Payer: Self-pay

## 2021-09-20 ENCOUNTER — Emergency Department: Payer: Medicare HMO

## 2021-09-20 DIAGNOSIS — U071 COVID-19: Secondary | ICD-10-CM | POA: Diagnosis not present

## 2021-09-20 DIAGNOSIS — R079 Chest pain, unspecified: Secondary | ICD-10-CM | POA: Diagnosis not present

## 2021-09-20 DIAGNOSIS — R059 Cough, unspecified: Secondary | ICD-10-CM | POA: Diagnosis not present

## 2021-09-20 DIAGNOSIS — R0789 Other chest pain: Secondary | ICD-10-CM | POA: Diagnosis not present

## 2021-09-20 LAB — COMPREHENSIVE METABOLIC PANEL
ALT: 17 U/L (ref 0–44)
AST: 20 U/L (ref 15–41)
Albumin: 3.7 g/dL (ref 3.5–5.0)
Alkaline Phosphatase: 74 U/L (ref 38–126)
Anion gap: 6 (ref 5–15)
BUN: 9 mg/dL (ref 8–23)
CO2: 24 mmol/L (ref 22–32)
Calcium: 8.7 mg/dL — ABNORMAL LOW (ref 8.9–10.3)
Chloride: 102 mmol/L (ref 98–111)
Creatinine, Ser: 0.62 mg/dL (ref 0.44–1.00)
GFR, Estimated: >60 mL/min (ref >60)
Glucose, Bld: 105 mg/dL — ABNORMAL HIGH (ref 70–99)
Potassium: 3.5 mmol/L (ref 3.5–5.1)
Sodium: 132 mmol/L — ABNORMAL LOW (ref 135–145)
Total Bilirubin: 0.3 mg/dL (ref 0.3–1.2)
Total Protein: 6.6 g/dL (ref 6.5–8.1)

## 2021-09-20 LAB — TROPONIN I (HIGH SENSITIVITY)
Troponin I (High Sensitivity): 2 ng/L (ref <18)
Troponin I (High Sensitivity): <2 ng/L (ref <18)

## 2021-09-20 LAB — RESP PANEL BY RT-PCR (FLU A&B, COVID) ARPGX2
Influenza A by PCR: NEGATIVE
Influenza B by PCR: NEGATIVE
SARS Coronavirus 2 by RT PCR: POSITIVE — AB

## 2021-09-20 LAB — CBC
HCT: 36.4 % (ref 36.0–46.0)
Hemoglobin: 12.5 g/dL (ref 12.0–15.0)
MCH: 31.9 pg (ref 26.0–34.0)
MCHC: 34.3 g/dL (ref 30.0–36.0)
MCV: 92.9 fL (ref 80.0–100.0)
Platelets: 251 10*3/uL (ref 150–400)
RBC: 3.92 MIL/uL (ref 3.87–5.11)
RDW: 12.7 % (ref 11.5–15.5)
WBC: 4.6 10*3/uL (ref 4.0–10.5)
nRBC: 0 % (ref 0.0–0.2)

## 2021-09-20 MED ORDER — NIRMATRELVIR/RITONAVIR (PAXLOVID)TABLET: 3.0000 | ORAL_TABLET | Freq: Two times a day (BID) | ORAL | 0 refills | Status: AC

## 2021-09-20 NOTE — ED Provider Notes (Signed)
[facility] Provider Note    Event Date/Time   First MD Initiated Contact with Patient 09/20/21 [phone]     (approximate)   History   Cough and Chest Pain   HPI  Mercedes Mcintosh is a 71 y.o. female who reports over the last 48 hours she has developed cough, sore throat, headache, body aches, fatigue and mild chest discomfort.  She reports she has been vaccinated against COVID and received 1 booster.  Denies sick contacts.  No abdominal pain nausea or vomiting.  Has taken Tylenol with little improvement     Physical Exam   Triage Vital Signs: ED Triage Vitals  Enc Vitals Group     BP 09/20/21 0150 107/63     Pulse Rate 09/20/21 0150 95     Resp 09/20/21 0150 18     Temp 09/20/21 0147 99.3 F (37.4 C)     Temp Source 09/20/21 0147 Oral     SpO2 09/20/21 0150 96 %     Weight 09/20/21 0148 52.2 kg (115 lb)     Height 09/20/21 0148 1.6 m (5\' 3" )     Head Circumference --      Peak Flow --      Pain Score 09/20/21 0148 9     Pain Loc --      Pain Edu? --      Excl. in GC? --     Most recent vital signs: Vitals:   09/20/21 0150 09/20/21 0453  BP: 107/63 111/64  Pulse: 95 80  Resp: 18 18  Temp:  98.9 F (37.2 C)  SpO2: 96% 96%     General: Awake, no distress.  CV:  Good peripheral perfusion.  Resp:  Normal effort.  CTA B Abd:  No distention.  Other:  No abdominal tenderness palpation   ED Results / Procedures / Treatments   Labs (all labs ordered are listed, but only abnormal results are displayed) Labs Reviewed  RESP PANEL BY RT-PCR (FLU A&B, COVID) ARPGX2 - Abnormal; Notable for the following components:      Result Value   SARS Coronavirus 2 by RT PCR POSITIVE (*)    All other components within normal limits  COMPREHENSIVE METABOLIC PANEL - Abnormal; Notable for the following components:   Sodium 132 (*)    Glucose, Bld 105 (*)    Calcium 8.7 (*)    All other components within normal limits  CBC  TROPONIN I (HIGH  SENSITIVITY)  TROPONIN I (HIGH SENSITIVITY)     EKG  ED ECG REPORT I, 11/18/21, the attending physician, personally viewed and interpreted this ECG.  Date: 09/20/2021  Rhythm: normal sinus rhythm QRS Axis: normal Intervals: normal ST/T Wave abnormalities: normal Narrative Interpretation: no evidence of acute ischemia    RADIOLOGY Chest x-ray read by me, no pneumonia    PROCEDURES:  Critical Care performed:   Procedures   MEDICATIONS ORDERED IN ED: Medications - No data to display   IMPRESSION / MDM / ASSESSMENT AND PLAN / ED COURSE  I reviewed the triage vital signs and the nursing notes.   Patient presents with symptoms highly suspicious for viral etiology, COVID and influenza are prevalent in the community this time.  Her chest x-ray is reassuring, no evidence of pneumonia  COVID/flu PCR has returned positive for COVID-19  CBC and CMP are unremarkable  Discussed with her treatment for COVID-19, have recommended paxlovid, discussed side effects with her  Return precautions discussed  FINAL CLINICAL IMPRESSION(S) / ED DIAGNOSES   Final diagnoses:  COVID-19     Rx / DC Orders   ED Discharge Orders          Ordered    nirmatrelvir/ritonavir EUA (PAXLOVID) 20 x 150 MG & 10 x 100MG  TABS  2 times daily        09/20/21 11/18/21             Note:  This document was prepared using Dragon voice recognition software and may include unintentional dictation errors.   4128, MD 09/20/21 [phone]

## 2021-09-20 NOTE — ED Triage Notes (Signed)
Pt in with cough, chest pain since yesterday. States has had headache and body aches.

## 2021-09-20 NOTE — ED Notes (Signed)
ED Provider at bedside. 

## 2021-09-21 ENCOUNTER — Ambulatory Visit: Payer: Medicare HMO | Admitting: Family

## 2021-09-28 ENCOUNTER — Other Ambulatory Visit: Payer: Self-pay

## 2021-09-28 ENCOUNTER — Ambulatory Visit (INDEPENDENT_AMBULATORY_CARE_PROVIDER_SITE_OTHER): Payer: Medicare HMO | Admitting: Family

## 2021-09-28 ENCOUNTER — Encounter: Payer: Self-pay | Admitting: Family

## 2021-09-28 VITALS — BP 104/70 | HR 88 | Temp 97.9°F | Ht 63.0 in | Wt 110.0 lb

## 2021-09-28 DIAGNOSIS — E871 Hypo-osmolality and hyponatremia: Secondary | ICD-10-CM

## 2021-09-28 DIAGNOSIS — K769 Liver disease, unspecified: Secondary | ICD-10-CM | POA: Diagnosis not present

## 2021-09-28 DIAGNOSIS — S22030A Wedge compression fracture of third thoracic vertebra, initial encounter for closed fracture: Secondary | ICD-10-CM | POA: Diagnosis not present

## 2021-09-28 DIAGNOSIS — M546 Pain in thoracic spine: Secondary | ICD-10-CM | POA: Diagnosis not present

## 2021-09-28 LAB — BASIC METABOLIC PANEL
BUN: 8 mg/dL (ref 6–23)
CO2: 30 mEq/L (ref 19–32)
Calcium: 9.1 mg/dL (ref 8.4–10.5)
Chloride: 104 mEq/L (ref 96–112)
Creatinine, Ser: 0.7 mg/dL (ref 0.40–1.20)
GFR: 88.68 mL/min (ref >60.00)
Glucose, Bld: 80 mg/dL (ref 70–99)
Potassium: 3.8 mEq/L (ref 3.5–5.1)
Sodium: 140 mEq/L (ref 135–145)

## 2021-09-28 NOTE — Patient Instructions (Signed)
Follow up as scheduled with neurosurgery as scheduled.   Your covid symptoms seem to improving, which is great! Let me know if starts to worsen at all.   Stop by the lab prior to leaving today. I will notify you of your results once received.   It was a pleasure seeing you today! Please do not hesitate to reach out with any questions and or concerns.  Regards,   Mort Sawyers FNP-C
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
## 2021-09-29 NOTE — Assessment & Plan Note (Signed)
Patient to continue with TLSO brace and wearing all day as indicated by the neurosurgeon.  Patient to follow-up with neurosurgeon as scheduled.

## 2021-09-29 NOTE — Assessment & Plan Note (Signed)
Noted to be low upon last lab results will repeat today pending results. °

## 2021-09-29 NOTE — Assessment & Plan Note (Signed)
Approximately 25 minutes was spent looking through the charts and prior notes as well as ER visits to include the dates from 08/31/2021 with a visit with Dr. Vladimir Crofts neurosurgeon as well as on the September 20 2021 when she was experiencing COVID and went to the urgent care.  Also included with investigation once reviewing the CT abdomen and pelvis from August 25, 2021 and the multiple x-rays as well as the CT head and chest that was done on 12/9 as well.

## 2021-09-29 NOTE — Assessment & Plan Note (Signed)
Patient currently being followed by neurosurgery and wearing TLSO brace.  Patient to continue wearing this brace until indicated by neurosurgery to discontinue.

## 2021-09-29 NOTE — Assessment & Plan Note (Signed)
Noted to be low upon last lab results will repeat today pending results.

## 2021-09-29 NOTE — Assessment & Plan Note (Signed)
Multiple lesion liver lesion seen on CT abdomen and pelvis incidentally during the ER visit.  Not further investigated.  Recommendation to order MRI, MRI ordered pending referrals at this point.  Also reviewed recent liver function test and these are all within normal range.  Patient without abdominal pain on assessment

## 2021-10-04 ENCOUNTER — Telehealth: Payer: Self-pay | Admitting: Family Medicine

## 2021-10-04 DIAGNOSIS — F3289 Other specified depressive episodes: Secondary | ICD-10-CM

## 2021-10-04 DIAGNOSIS — G4700 Insomnia, unspecified: Secondary | ICD-10-CM

## 2021-10-04 MED ORDER — TRAZODONE HCL 50 MG PO TABS: 50.0000 mg | ORAL_TABLET | Freq: Every evening | ORAL | 0 refills | Status: DC | PRN

## 2021-10-04 MED ORDER — ESCITALOPRAM OXALATE 10 MG PO TABS: 10.0000 mg | ORAL_TABLET | Freq: Every day | ORAL | 0 refills | Status: DC

## 2021-10-04 NOTE — Telephone Encounter (Signed)
Gabapentin: Last filled by historical provider Trazodone: last filled 07/20/21 #30 w/ 3 Lexapro: last filled 05/09/21 #90 w/ 0   Last OV: 09/28/21 for ED f/u

## 2021-10-04 NOTE — Telephone Encounter (Signed)
°  Encourage patient to contact the pharmacy for refills or they can request refills through [facility]  LAST APPOINTMENT DATE:  Please schedule appointment if longer than 1 year  NEXT APPOINTMENT DATE:  MEDICATION:gabapentin (NEURONTIN) 300 MG capsule   traZODone (DESYREL) 50 MG tablet  escitalopram (LEXAPRO) 10 MG tablet  Is the patient out of medication?   PHARMACY:[id number] - [place], [place] - [id number]    Let patient know to contact pharmacy at the end of the day to make sure medication is ready.  Please notify patient to allow 48-72 hours to process  CLINICAL FILLS OUT ALL BELOW:   LAST REFILL:  QTY:  REFILL DATE:    OTHER COMMENTS:    Okay for refill?  Please advise

## 2021-10-04 NOTE — Telephone Encounter (Addendum)
Noted. Reviewed Dr. Verda Cumins office notes.  Refill(s) sent to pharmacy for Lexapro and Trazodone.  Not sure where the gabapentin came from? Her neurosurgeon? No documentation in [facility]'s notes about this. Have her call the person who prescribed it.

## 2021-10-05 ENCOUNTER — Other Ambulatory Visit: Payer: Self-pay | Admitting: Family Medicine

## 2021-10-05 DIAGNOSIS — M546 Pain in thoracic spine: Secondary | ICD-10-CM

## 2021-10-05 NOTE — Telephone Encounter (Signed)
Pt returned my call and I notified her that she would need to call the prescriber of the gabapentin and the tramadol. Pt states she will call her neurologist at [facility].

## 2021-10-05 NOTE — Telephone Encounter (Signed)
Left VM for pt asking for a call back

## 2021-10-11 ENCOUNTER — Encounter: Payer: Self-pay | Admitting: Family

## 2021-10-11 NOTE — Telephone Encounter (Signed)
Called and left a VM letting pt know that she would just need to call [facility] to get her MRI scheduled. Phone number provided.

## 2021-10-11 NOTE — Telephone Encounter (Signed)
Pt called asking where do she go for MRI. Please advise.

## 2021-10-12 ENCOUNTER — Other Ambulatory Visit: Payer: Self-pay

## 2021-10-12 ENCOUNTER — Ambulatory Visit (INDEPENDENT_AMBULATORY_CARE_PROVIDER_SITE_OTHER): Payer: Medicare HMO | Admitting: Family

## 2021-10-12 ENCOUNTER — Encounter: Payer: Self-pay | Admitting: Family

## 2021-10-12 DIAGNOSIS — I959 Hypotension, unspecified: Secondary | ICD-10-CM | POA: Diagnosis not present

## 2021-10-12 DIAGNOSIS — S22030A Wedge compression fracture of third thoracic vertebra, initial encounter for closed fracture: Secondary | ICD-10-CM

## 2021-10-12 DIAGNOSIS — K769 Liver disease, unspecified: Secondary | ICD-10-CM
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
## 2021-10-12 NOTE — Assessment & Plan Note (Signed)
Advised patient that she has a longstanding history of hypertension.  Patient to drink more water throughout the day as she is currently only drinking about 2 bottles daily.  I have advised her to increase to 405 as tolerated.  Also to eat every 2-3 hours throughout the day as suspect that she also may drop in blood sugar throughout the day.

## 2021-10-12 NOTE — Patient Instructions (Addendum)
Your blood pressure seems to be stable from visits in the past and I am not worried that you have hypotension as this is normal for you.  I do suggest that you drink 4 to 5 glasses of water a day however as this may help raise it and I would suggest that you have snacks every 2-3 hours throughout the day.  It was a pleasure seeing you today! Please do not hesitate to reach out with any questions and or concerns.  An MRI of your abdomen was ordered,  Please call [facility] [phone] to schedule your MRI.   Regards,   Janthony Holleman FNP-C .

## 2021-10-12 NOTE — Assessment & Plan Note (Signed)
Discussed in length why this was ordered and patient acknowledged understanding and wants to proceed with the MRI of the liver.  Patient to call [facility] to schedule as this is already been approved with her insurance.

## 2021-10-12 NOTE — Assessment & Plan Note (Signed)
Handicap placard filled out per patient request.  Form given to patient before leaving

## 2021-10-17 DIAGNOSIS — S22030A Wedge compression fracture of third thoracic vertebra, initial encounter for closed fracture: Secondary | ICD-10-CM | POA: Diagnosis not present

## 2021-10-17 DIAGNOSIS — S22030D Wedge compression fracture of third thoracic vertebra, subsequent encounter for fracture with routine healing: Secondary | ICD-10-CM | POA: Diagnosis not present

## 2021-10-17 DIAGNOSIS — Z8739 Personal history of other diseases of the musculoskeletal system and connective tissue: Secondary | ICD-10-CM | POA: Diagnosis not present

## 2021-10-17 DIAGNOSIS — M439 Deforming dorsopathy, unspecified: Secondary | ICD-10-CM | POA: Diagnosis not present

## 2021-10-24 ENCOUNTER — Ambulatory Visit
Admission: RE | Admit: 2021-10-24 | Discharge: 2021-10-24 | Disposition: A | Discharge status: Home / Self Care | Payer: Medicare HMO | Source: Ambulatory Visit | Attending: Family | Admitting: Family

## 2021-10-24 ENCOUNTER — Other Ambulatory Visit: Payer: Self-pay

## 2021-10-24 DIAGNOSIS — K769 Liver disease, unspecified: Secondary | ICD-10-CM | POA: Insufficient documentation

## 2021-10-24 DIAGNOSIS — K7689 Other specified diseases of liver: Secondary | ICD-10-CM | POA: Diagnosis not present

## 2021-10-24 MED ORDER — GADOBUTROL 1 MMOL/ML IV SOLN
5.0000 mL | Freq: Once | INTRAVENOUS | Status: AC | PRN
  Administered 2021-10-24: 5 mL via INTRAVENOUS

## 2021-10-25 ENCOUNTER — Ambulatory Visit: Payer: Medicare HMO

## 2021-10-25 DIAGNOSIS — M5416 Radiculopathy, lumbar region: Secondary | ICD-10-CM | POA: Diagnosis not present
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
## 2021-10-30 ENCOUNTER — Ambulatory Visit (LOCAL_COMMUNITY_HEALTH_CENTER): Payer: Self-pay

## 2021-10-30 ENCOUNTER — Other Ambulatory Visit: Payer: Self-pay

## 2021-10-30 DIAGNOSIS — Z111 Encounter for screening for respiratory tuberculosis: Secondary | ICD-10-CM

## 2021-11-02 ENCOUNTER — Other Ambulatory Visit: Payer: Self-pay

## 2021-11-02 ENCOUNTER — Ambulatory Visit (LOCAL_COMMUNITY_HEALTH_CENTER): Payer: Medicare HMO

## 2021-11-02 DIAGNOSIS — Z111 Encounter for screening for respiratory tuberculosis: Secondary | ICD-10-CM

## 2021-11-02 LAB — TB SKIN TEST
Induration: 0 mm
TB Skin Test: NEGATIVE

## 2021-11-12 ENCOUNTER — Ambulatory Visit
Admission: EM | Admit: 2021-11-12 | Discharge: 2021-11-12 | Disposition: A | Discharge status: Home / Self Care | Payer: Medicare HMO | Attending: Family Medicine | Admitting: Family Medicine

## 2021-11-12 ENCOUNTER — Encounter: Payer: Self-pay | Admitting: Emergency Medicine

## 2021-11-12 ENCOUNTER — Other Ambulatory Visit: Payer: Self-pay

## 2021-11-12 DIAGNOSIS — J029 Acute pharyngitis, unspecified: Secondary | ICD-10-CM

## 2021-11-12 DIAGNOSIS — Z20822 Contact with and (suspected) exposure to covid-19: Secondary | ICD-10-CM | POA: Diagnosis not present

## 2021-11-12 DIAGNOSIS — R053 Chronic cough: Secondary | ICD-10-CM

## 2021-11-12 DIAGNOSIS — J019 Acute sinusitis, unspecified: Secondary | ICD-10-CM

## 2021-11-12 LAB — POCT RAPID STREP A (OFFICE): Rapid Strep A Screen: NEGATIVE

## 2021-11-12 MED ORDER — PREDNISONE 20 MG PO TABS: 10.0000 mg | ORAL_TABLET | Freq: Every day | ORAL | 0 refills | Status: DC

## 2021-11-12 MED ORDER — BENZONATATE 100 MG PO CAPS: 100.0000 mg | ORAL_CAPSULE | Freq: Three times a day (TID) | ORAL | 0 refills | Status: DC | PRN

## 2021-11-12 MED ORDER — PREDNISONE 10 MG PO TABS: 10.0000 mg | ORAL_TABLET | Freq: Every day | ORAL | 0 refills | Status: AC

## 2021-11-12 MED ORDER — AMOXICILLIN 875 MG PO TABS: 875.0000 mg | ORAL_TABLET | Freq: Two times a day (BID) | ORAL | 0 refills | Status: AC

## 2021-11-12 NOTE — ED Triage Notes (Signed)
Pt here with cough, congestion, headache x 2 days.
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
## 2021-11-12 NOTE — Discharge Instructions (Signed)
COVID/Flu test pending and will result within 3 days. Treating you for sinus infection, sore throat and cough. Treatment per medication orders. Take as prescribed. If symptoms worsen or do no readily improve return for evaluation

## 2021-11-13 LAB — COVID-19, FLU A+B NAA
Influenza A, NAA: NOT DETECTED
Influenza B, NAA: NOT DETECTED
SARS-CoV-2, NAA: NOT DETECTED

## 2021-11-30 DIAGNOSIS — S22030D Wedge compression fracture of third thoracic vertebra, subsequent encounter for fracture with routine healing: Secondary | ICD-10-CM | POA: Diagnosis not present

## 2021-12-05 ENCOUNTER — Encounter: Payer: Self-pay | Admitting: Family Medicine

## 2021-12-05 ENCOUNTER — Other Ambulatory Visit: Payer: Self-pay

## 2021-12-05 ENCOUNTER — Ambulatory Visit (INDEPENDENT_AMBULATORY_CARE_PROVIDER_SITE_OTHER): Payer: Medicare HMO | Admitting: Family Medicine

## 2021-12-05 VITALS — BP 110/62 | HR 114 | Temp 98.6°F | Ht 63.0 in | Wt 108.0 lb

## 2021-12-05 DIAGNOSIS — S22030D Wedge compression fracture of third thoracic vertebra, subsequent encounter for fracture with routine healing: Secondary | ICD-10-CM

## 2021-12-05 LAB — VITAMIN D 25 HYDROXY (VIT D DEFICIENCY, FRACTURES): VITD: 26.06 ng/mL — ABNORMAL LOW (ref 30.00–100.00)

## 2021-12-05 MED ORDER — MELOXICAM 7.5 MG PO TABS: 7.5000 mg | ORAL_TABLET | Freq: Every day | ORAL | 0 refills | Status: DC

## 2021-12-05 NOTE — Patient Instructions (Addendum)
Please call the location of your choice from the menu below to schedule your Mammogram and/or Bone Density appointment.   ? ? ?[place] ? ?[facility]   ?Phone:  [phone]   ?[address]                                                                            ?[place], [place] [address]                                            ?Services: 3D Mammogram and Bone Density ? ? ? ?Would recommend the following for bone health:  ? ?1) 800 units of Vitamin D daily ?2) Get 1200 mg of elemental calcium --- this is best from your diet. Try to track how much calcium you get on a typical day. You could find ways to add more (dairy products, leafy greens). Take a supplement for whatever you don't typically get so you reach 1200 mg of calcium.  ?3) Physical activity (ideally weight bearing) - like walking briskly 30 minutes 5 days a week.  ? ?Information: Osteoporosis increases your risk of having a fracture.  ? ?Bisphosphonate therapy is the first line treatment for prevention of fractures.  ? ?Alendronate (daily or weekly) ?Risedronate (daily or weekly) ?Zoledronic acid (IV 5 mg yearly) ? ?Common Side effects: Heartburn and Acid Reflux symptoms ? ?Less common:  ?1) Osteonecrosis of the Jaw - presents with jaw pain, swelling, local infection ?2) Femur Fracture ?3) Ocular Side effects - pain, blurred vision, conjunctivitis ? ? ? ? ? ?
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
## 2021-12-06 ENCOUNTER — Other Ambulatory Visit: Payer: Self-pay | Admitting: Family Medicine

## 2021-12-06 DIAGNOSIS — E559 Vitamin D deficiency, unspecified: Secondary | ICD-10-CM

## 2021-12-06 MED ORDER — CHOLECALCIFEROL 1.25 MG (50000 UT) PO TABS: 1.0000 | ORAL_TABLET | ORAL | 1 refills | Status: DC

## 2022-01-10 ENCOUNTER — Ambulatory Visit (INDEPENDENT_AMBULATORY_CARE_PROVIDER_SITE_OTHER): Payer: Medicare HMO | Admitting: Family Medicine

## 2022-01-10 VITALS — BP 80/58 | HR 70 | Temp 97.9°F | Ht 63.0 in | Wt 109.3 lb

## 2022-01-10 DIAGNOSIS — S22030D Wedge compression fracture of third thoracic vertebra, subsequent encounter for fracture with routine healing: Secondary | ICD-10-CM | POA: Diagnosis not present

## 2022-01-10 DIAGNOSIS — I959 Hypotension, unspecified: Secondary | ICD-10-CM

## 2022-01-10 DIAGNOSIS — E871 Hypo-osmolality and hyponatremia: Secondary | ICD-10-CM

## 2022-01-10 DIAGNOSIS — R5383 Other fatigue: Secondary | ICD-10-CM | POA: Diagnosis not present

## 2022-01-10 LAB — COMPREHENSIVE METABOLIC PANEL
ALT: 15 U/L (ref 0–35)
AST: 17 U/L (ref 0–37)
Albumin: 3.8 g/dL (ref 3.5–5.2)
Alkaline Phosphatase: 70 U/L (ref 39–117)
BUN: 7 mg/dL (ref 6–23)
CO2: 29 mEq/L (ref 19–32)
Calcium: 9.2 mg/dL (ref 8.4–10.5)
Chloride: 104 mEq/L (ref 96–112)
Creatinine, Ser: 0.68 mg/dL (ref 0.40–1.20)
GFR: 89.13 mL/min (ref >60.00)
Glucose, Bld: 82 mg/dL (ref 70–99)
Potassium: 4.1 mEq/L (ref 3.5–5.1)
Sodium: 138 mEq/L (ref 135–145)
Total Bilirubin: 0.3 mg/dL (ref 0.2–1.2)
Total Protein: 6.6 g/dL (ref 6.0–8.3)

## 2022-01-10 LAB — CBC WITH DIFFERENTIAL/PLATELET
Basophils Absolute: 0 10*3/uL (ref 0.0–0.1)
Basophils Relative: 0.6 % (ref 0.0–3.0)
Eosinophils Absolute: 0.3 10*3/uL (ref 0.0–0.7)
Eosinophils Relative: 5.4 % — ABNORMAL HIGH (ref 0.0–5.0)
HCT: 40.4 % (ref 36.0–46.0)
Hemoglobin: 13.3 g/dL (ref 12.0–15.0)
Lymphocytes Relative: 41.1 % (ref 12.0–46.0)
Lymphs Abs: 2.1 10*3/uL (ref 0.7–4.0)
MCHC: 33 g/dL (ref 30.0–36.0)
MCV: 95.2 fl (ref 78.0–100.0)
Monocytes Absolute: 0.5 10*3/uL (ref 0.1–1.0)
Monocytes Relative: 10.6 % (ref 3.0–12.0)
Neutro Abs: 2.2 10*3/uL (ref 1.4–7.7)
Neutrophils Relative %: 42.3 % — ABNORMAL LOW (ref 43.0–77.0)
Platelets: 293 10*3/uL (ref 150.0–400.0)
RBC: 4.24 Mil/uL (ref 3.87–5.11)
RDW: 14 % (ref 11.5–15.5)
WBC: 5.2 10*3/uL (ref 4.0–10.5)

## 2022-01-10 LAB — FERRITIN: Ferritin: 46.4 ng/mL (ref 10.0–291.0)

## 2022-01-10 LAB — TSH: TSH: 0.58 u[IU]/mL (ref 0.35–5.50)
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
## 2022-01-10 NOTE — Progress Notes (Signed)
? ?Subjective:  ? ?  ?Mercedes Mcintosh is a 71 y.o. female presenting for Follow-up (Back fracture) ?  ? ? ?HPI ? ?#Compression fracture ?- ache come and go ?- not often ?- overall feel she is doing better ? ? ?#Vitamin d ?- took 4 weeks ? ?#daytime tiredness ?- taking vitamin ?- feeling tired at 6 pm ?- sleepy ?- goes to sleep 11 pm - 9 am ? ?- works a part-time job 3 hours per day ?- does have to pick up her grandson at midnight from work  ?- typically sleeps from 1 am to 9 am ?- wakes up rested ?- has not been at [employer] since the injury ? ?Hx of covid ?- lack of taste ?- has not been eating as much ?- tries to eats veggies ?- stays hydrated ?- is fatigued ?-  ? ?Review of Systems  ?Constitutional:  Positive for fatigue. Negative for chills and fever.  ?HENT:  Negative for congestion and sinus pressure.   ?Respiratory:  Negative for cough and shortness of breath.   ?Cardiovascular:  Negative for chest pain.  ?Gastrointestinal:  Positive for nausea. Negative for diarrhea and vomiting.  ?Genitourinary:  Negative for dysuria, frequency and urgency.  ?Neurological:  Positive for light-headedness. Negative for headaches.  ? ? ?Social History  ? ?Tobacco Use  ?Smoking Status Former  ? Packs/day: 0.10  ? Years: 8.00  ? Pack years: 0.80  ? Types: Cigarettes  ? Quit date: 2012  ? Years since quitting: 11.3  ?Smokeless Tobacco Never  ? ? ? ?   ?Objective:  ?  ?BP Readings from Last 3 Encounters:  ?01/10/22 (!) 80/58  ?12/05/21 110/62  ?11/12/21 118/69  ? ?Wt Readings from Last 3 Encounters:  ?01/10/22 109 lb 5 oz (49.6 kg)  ?12/05/21 108 lb (49 kg)  ?10/12/21 112 lb (50.8 kg)  ? ? ?BP (!) 80/58   Pulse 70   Temp 97.9 ?F (36.6 ?C) (Oral)   Ht 5\' 3"  (1.6 m)   Wt 109 lb 5 oz (49.6 kg)   SpO2 97%   BMI 19.36 kg/m?  ? ? ?Physical Exam ?Constitutional:   ?   General: She is not in acute distress. ?   Appearance: She is well-developed. She is not diaphoretic.  ?HENT:  ?   Right Ear: External ear normal.  ?   Left Ear:  External ear normal.  ?   Nose: Nose normal.  ?Eyes:  ?   Conjunctiva/sclera: Conjunctivae normal.  ?Cardiovascular:  ?   Rate and Rhythm: Normal rate and regular rhythm.  ?   Heart sounds: No murmur heard. ?Pulmonary:  ?   Effort: Pulmonary effort is normal. No respiratory distress.  ?   Breath sounds: Normal breath sounds. No wheezing.  ?Musculoskeletal:  ?   Cervical back: Neck supple.  ?Skin: ?   General: Skin is warm and dry.  ?   Capillary Refill: Capillary refill takes less than 2 seconds.  ?Neurological:  ?   Mental Status: She is alert. Mental status is at baseline.  ?Psychiatric:     ?   Mood and Affect: Mood normal.     ?   Behavior: Behavior normal.  ? ? ? ? ? ?   ?Assessment & Plan:  ? ?Problem List Items Addressed This Visit   ? ?  ? Cardiovascular and Mediastinum  ? Hypotension - Primary  ?  Blood pressure lower than her normal today.  At beginning of visit she was  feeling well, later during the visit she started to become hot and lightheaded.  She was given water and recovered. Due to her ongoing fatigue discussed getting blood work.  Strict ER precautions precautions discussed with patient if she develops new symptoms or fevers chills she should go to the emergency department. ? ?  ?  ? Relevant Orders  ? CBC with Differential  ?  ? Musculoskeletal and Integument  ? Compression fracture of T3 vertebra (HCC)  ?  Recovering well. Dexa next month ? ?  ?  ?  ? Other  ? Hyponatremia  ? Relevant Orders  ? Comprehensive metabolic panel  ? Other fatigue  ?  Patient notes fatigue over the last few months since her back injury.  Suspect this is multifactorial, low vitamin D currently on supplement.  She also had COVID in January suspect this could be some lingering COVID fatigue.  Since the back injury she has not been active is much as previously.  Advise getting back into her regular physical activity.  Of note she is also hypotensive today and did become lightheaded fatigue seems more chronic than acute  but do wonder about chronic low blood pressure lab work today to evaluate for anemia as well as iron deficiency.  ER precautions as discussed in hypotension plan. ? ?  ?  ? Relevant Orders  ? TSH  ? Ferritin  ? ? ? ?Return in about 4 weeks (around 02/07/2022) for bone density and fatigue. ? ?Lynnda Child, MD ? ? ? ?

## 2022-01-10 NOTE — Patient Instructions (Addendum)
Complete vitamin D weekly ?After you finish take Vitamin D [phone] units daily ? ? ?Fatigue ?- get back into exercising ?- may just be post covid ?- work on regular sleep ?- return in 4 weeks and we can get labs  ? ?Low blood pressure ?- wear compression socks ?- labs today ?- update if new symptoms like: worsening lightheadedness, fevers, chills, urinary symptoms ?

## 2022-01-10 NOTE — Assessment & Plan Note (Signed)
Blood pressure lower than her normal today.  At beginning of visit she was feeling well, later during the visit she started to become hot and lightheaded.  She was given water and recovered. Due to her ongoing fatigue discussed getting blood work.  Strict ER precautions precautions discussed with patient if she develops new symptoms or fevers chills she should go to the emergency department. ?

## 2022-01-10 NOTE — Assessment & Plan Note (Signed)
Recovering well. Dexa next month ?

## 2022-01-23 DIAGNOSIS — H524 Presbyopia: Secondary | ICD-10-CM | POA: Diagnosis not present

## 2022-01-25 ENCOUNTER — Ambulatory Visit
Admission: RE | Admit: 2022-01-25 | Discharge: 2022-01-25 | Disposition: A | Discharge status: Home / Self Care | Payer: Medicare HMO | Source: Ambulatory Visit | Attending: Family Medicine | Admitting: Family Medicine

## 2022-01-25 DIAGNOSIS — Z1231 Encounter for screening mammogram for malignant neoplasm of breast: Secondary | ICD-10-CM | POA: Insufficient documentation

## 2022-01-25 DIAGNOSIS — Z780 Asymptomatic menopausal state: Secondary | ICD-10-CM | POA: Diagnosis not present

## 2022-01-25 DIAGNOSIS — M8589 Other specified disorders of bone density and structure, multiple sites: Secondary | ICD-10-CM | POA: Diagnosis not present

## 2022-01-25 DIAGNOSIS — E2839 Other primary ovarian failure: Secondary | ICD-10-CM | POA: Diagnosis not present

## 2022-01-25 DIAGNOSIS — M810 Age-related osteoporosis without current pathological fracture: Secondary | ICD-10-CM | POA: Diagnosis not present

## 2022-01-31 ENCOUNTER — Telehealth: Payer: Self-pay | Admitting: Family Medicine

## 2022-01-31 NOTE — Telephone Encounter (Signed)
Left message for patient to call back and schedule Medicare Annual Wellness Visit (AWV) either virtually or phone ? ?awvi 12/17/18 per [facility]  ? please schedule at anytime with health coach ? ?This should be a 45 minute visit.  ? ?I left my direct # [phone] ?

## 2022-02-06 ENCOUNTER — Other Ambulatory Visit: Payer: Self-pay | Admitting: Primary Care

## 2022-02-06 DIAGNOSIS — G4700 Insomnia, unspecified: Secondary | ICD-10-CM

## 2022-02-14 ENCOUNTER — Other Ambulatory Visit: Payer: Self-pay | Admitting: Family Medicine

## 2022-02-14 ENCOUNTER — Ambulatory Visit (INDEPENDENT_AMBULATORY_CARE_PROVIDER_SITE_OTHER): Payer: Medicare HMO | Admitting: Family Medicine

## 2022-02-14 VITALS — BP 80/60 | HR 84 | Temp 97.8°F | Ht 63.0 in | Wt 110.2 lb

## 2022-02-14 DIAGNOSIS — E559 Vitamin D deficiency, unspecified: Secondary | ICD-10-CM | POA: Diagnosis not present

## 2022-02-14 DIAGNOSIS — I959 Hypotension, unspecified: Secondary | ICD-10-CM | POA: Diagnosis not present

## 2022-02-14 DIAGNOSIS — R5383 Other fatigue: Secondary | ICD-10-CM

## 2022-02-14 DIAGNOSIS — M810 Age-related osteoporosis without current pathological fracture: Secondary | ICD-10-CM | POA: Insufficient documentation

## 2022-02-14 NOTE — Assessment & Plan Note (Signed)
Complete high-dose replacement.  Then start daily supplement.  If we do start Fosamax, will repeat vitamin D prior to starting

## 2022-02-14 NOTE — Patient Instructions (Addendum)
#  Referral I have placed a referral to a specialist for you. You should receive a phone call from the specialty office. Make sure your voicemail is not full and that if you are able to answer your phone to unknown or new numbers.   It may take up to 2 weeks to hear about the referral. If you do not hear anything in 2 weeks, please call our office and ask to speak with the referral coordinator.  - Cardiology   Your DEXA showed osteopenia.   This means that she is at risk for developing osteoporosis and have some signs of low bone mass.   Would recommend the following:   1) 800 units of Vitamin D daily 2) Get 1200 mg of elemental calcium --- this is best from your diet. Try to track how much calcium you get on a typical day. You could find ways to add more (dairy products, leafy greens). Take a supplement for whatever you don't typically get so you reach 1200 mg of calcium.  3) Physical activity (ideally weight bearing) - like walking briskly 30 minutes 5 days a week.   Osteoporosis increases your risk of having a fracture.   Bisphosphonate therapy is the first line treatment for prevention of fractures.   Alendronate (daily or weekly) Risedronate (daily or weekly) Zoledronic acid (IV 5 mg yearly)  Common Side effects: Heartburn and Acid Reflux symptoms  Less common:  1) Osteonecrosis of the Jaw - presents with jaw pain, swelling, local infection 2) Femur Fracture 3) Ocular Side effects - pain, blurred vision, conjunctivitis

## 2022-02-14 NOTE — Assessment & Plan Note (Addendum)
No lightheadedness or dizziness -recent blood work reassuring, no signs of illness. But wonder if low blood pressure is contributing to her overall fatigue.  Referral to cardiology for further evaluation and consideration for treatment.

## 2022-02-14 NOTE — Progress Notes (Signed)
Subjective:     Mercedes Mcintosh is a 71 y.o. female presenting for Osteoporosis     HPI  #osteoporosis - recent fracture - will do weight bearing exercise  #Fatigue - feels tired frequently - is taking vitamin d replacement - no change in symptoms   Review of Systems   Social History   Tobacco Use  Smoking Status Former   Packs/day: 0.10   Years: 8.00   Pack years: 0.80   Types: Cigarettes   Quit date: 2012   Years since quitting: 11.4  Smokeless Tobacco Never        Objective:    BP Readings from Last 3 Encounters:  02/14/22 (!) 80/60  01/10/22 (!) 80/58  12/05/21 110/62   Wt Readings from Last 3 Encounters:  02/14/22 110 lb 4 oz (50 kg)  01/10/22 109 lb 5 oz (49.6 kg)  12/05/21 108 lb (49 kg)    BP (!) 80/60   Pulse 84   Temp 97.8 F (36.6 C) (Temporal)   Ht 5\' 3"  (1.6 m)   Wt 110 lb 4 oz (50 kg)   SpO2 98%   BMI 19.53 kg/m    Physical Exam Constitutional:      General: She is not in acute distress.    Appearance: She is well-developed. She is not diaphoretic.  HENT:     Right Ear: External ear normal.     Left Ear: External ear normal.     Nose: Nose normal.  Eyes:     Conjunctiva/sclera: Conjunctivae normal.  Cardiovascular:     Rate and Rhythm: Normal rate.  Pulmonary:     Effort: Pulmonary effort is normal.  Musculoskeletal:     Cervical back: Neck supple.  Skin:    General: Skin is warm and dry.     Capillary Refill: Capillary refill takes less than 2 seconds.  Neurological:     Mental Status: She is alert. Mental status is at baseline.  Psychiatric:        Mood and Affect: Mood normal.        Behavior: Behavior normal.          Assessment & Plan:   Problem List Items Addressed This Visit       Cardiovascular and Mediastinum   Hypotension - Primary    No lightheadedness or dizziness -recent blood work reassuring, no signs of illness. But wonder if low blood pressure is contributing to her overall fatigue.   Referral to cardiology for further evaluation and consideration for treatment.       Relevant Orders   Ambulatory referral to Cardiology     Musculoskeletal and Integument   Osteoporosis of forearm    Reviewed recent DEXA with patient.  Discussed osteoporosis of forearm, as well as osteopenia in several sites.  Discussed with recent compression fracture would recommend treatment for osteoporosis.  Discussed risk and side effects of bisphosphonate specifically Fosamax.  At this time patient would prefer not to be on a medication.  She will work on vitamin D and calcium supplementation as well as weightbearing exercise.  We will repeat DEXA within 2 years.  She will contact if she would like to start medication sooner.       Relevant Medications   Vitamin D, Ergocalciferol, (DRISDOL) 1.25 MG (50000 UNIT) CAPS capsule     Other   Other fatigue   Relevant Orders   Ambulatory referral to Cardiology   Vitamin D deficiency    Complete high-dose replacement.  Then start daily supplement.  If we do start Fosamax, will repeat vitamin D prior to starting         Return if symptoms worsen or fail to improve.  Lynnda Child, MD

## 2022-02-14 NOTE — Assessment & Plan Note (Addendum)
Reviewed recent DEXA with patient.  Discussed osteoporosis of forearm, as well as osteopenia in several sites.  Discussed with recent compression fracture would recommend treatment for osteoporosis.  Discussed risk and side effects of bisphosphonate specifically Fosamax.  At this time patient would prefer not to be on a medication.  She will work on vitamin D and calcium supplementation as well as weightbearing exercise.  We will repeat DEXA within 2 years.  She will contact if she would like to start medication sooner.

## 2022-04-04 ENCOUNTER — Ambulatory Visit: Payer: Medicare HMO | Admitting: Cardiovascular Disease

## 2022-04-04 NOTE — Progress Notes (Deleted)
NO SHOW

## 2022-04-05 ENCOUNTER — Encounter: Payer: Self-pay | Admitting: Cardiovascular Disease

## 2022-04-10 ENCOUNTER — Telehealth: Payer: Self-pay | Admitting: Family Medicine

## 2022-04-10 NOTE — Telephone Encounter (Signed)
Left message for patient to call back and schedule Medicare Annual Wellness Visit (AWV) either virtually or phone    awvi 12/17/18 per [facility]    This should be a 45 minute visit.  I left my direct # [phone]

## 2022-04-20 ENCOUNTER — Other Ambulatory Visit: Payer: Self-pay | Admitting: Primary Care

## 2022-04-20 DIAGNOSIS — F3289 Other specified depressive episodes: Secondary | ICD-10-CM

## 2022-05-11 ENCOUNTER — Other Ambulatory Visit: Payer: Self-pay | Admitting: Family Medicine

## 2022-05-11 DIAGNOSIS — G4700 Insomnia, unspecified: Secondary | ICD-10-CM

## 2022-05-11 NOTE — Telephone Encounter (Signed)
Patient scheduled.

## 2022-05-29 ENCOUNTER — Encounter: Payer: Medicare HMO | Admitting: Family Medicine

## 2022-06-05 ENCOUNTER — Ambulatory Visit (INDEPENDENT_AMBULATORY_CARE_PROVIDER_SITE_OTHER): Payer: Medicare HMO | Admitting: Family Medicine

## 2022-06-05 VITALS — BP 82/50 | HR 73 | Temp 97.7°F | Ht 62.25 in | Wt 112.1 lb

## 2022-06-05 DIAGNOSIS — Z1211 Encounter for screening for malignant neoplasm of colon: Secondary | ICD-10-CM | POA: Diagnosis not present

## 2022-06-05 DIAGNOSIS — F32A Depression, unspecified: Secondary | ICD-10-CM | POA: Insufficient documentation

## 2022-06-05 DIAGNOSIS — Z0000 Encounter for general adult medical examination without abnormal findings: Secondary | ICD-10-CM

## 2022-06-05 DIAGNOSIS — M5442 Lumbago with sciatica, left side: Secondary | ICD-10-CM | POA: Diagnosis not present

## 2022-06-05 DIAGNOSIS — Z23 Encounter for immunization: Secondary | ICD-10-CM

## 2022-06-05 DIAGNOSIS — G8929 Other chronic pain: Secondary | ICD-10-CM

## 2022-06-05 DIAGNOSIS — F3289 Other specified depressive episodes: Secondary | ICD-10-CM

## 2022-06-05 DIAGNOSIS — M5441 Lumbago with sciatica, right side: Secondary | ICD-10-CM | POA: Diagnosis not present

## 2022-06-05 DIAGNOSIS — F330 Major depressive disorder, recurrent, mild: Secondary | ICD-10-CM | POA: Diagnosis not present

## 2022-06-05 MED ORDER — ESCITALOPRAM OXALATE 20 MG PO TABS: 20.0000 mg | ORAL_TABLET | Freq: Every day | ORAL | 0 refills | Status: DC

## 2022-06-05 NOTE — Patient Instructions (Addendum)
See if you can remember what GI doctor did a colonoscopy so we can request records   Get the Shingles and Tdap at the pharmacy New covid booster - at the pharmacy   #Referral I have placed a referral to a specialist for you. You should receive a phone call from the specialty office. Make sure your voicemail is not full and that if you are able to answer your phone to unknown or new numbers.   It may take up to 2 weeks to hear about the referral. If you do not hear anything in 2 weeks, please call our office and ask to speak with the referral coordinator.  - physical therapy - GI

## 2022-06-05 NOTE — Assessment & Plan Note (Signed)
She notes return of symptoms, referral back to physical therapy to work with them.  She has seen neurosurgery, advised follow-up if symptoms do not improve.

## 2022-06-05 NOTE — Progress Notes (Signed)
Subjective:   Mercedes Mcintosh is a 71 y.o. female who presents for Medicare Annual (Subsequent) preventive examination.  Review of Systems    Review of Systems  Constitutional:  Negative for chills and fever.  HENT:  Negative for congestion and sore throat.   Eyes:  Negative for blurred vision and double vision.  Respiratory:  Negative for shortness of breath.   Cardiovascular:  Negative for chest pain.  Gastrointestinal:  Negative for heartburn, nausea and vomiting.  Genitourinary: Negative.   Musculoskeletal:  Positive for back pain. Negative for myalgias.  Skin:  Negative for rash.  Neurological:  Negative for dizziness and headaches.  Endo/Heme/Allergies:  Does not bruise/bleed easily.  Psychiatric/Behavioral:  Negative for depression. The patient is not nervous/anxious.     Cardiac Risk Factors include: advanced age (>2255men, 37>65 women);dyslipidemia;smoking/ tobacco exposure     Objective:    Today's Vitals   06/05/22 1111 06/05/22 1121  BP: (!) 82/50   Pulse: 73   Temp: 97.7 F (36.5 C)   TempSrc: Temporal   SpO2: 99%   Weight: 112 lb 2 oz (50.9 kg)   Height: 5' 2.25" (1.581 m)   PainSc:  6    Body mass index is 20.34 kg/m.     08/25/2021    1:59 PM 06/02/2021   10:40 AM 06/27/2019    6:49 PM 10/13/2018    7:29 PM 06/04/2015    6:29 PM  Advanced Directives  Does Patient Have a Medical Advance Directive? No No No No No  Would patient like information on creating a medical advance directive? No - Patient declined No - Patient declined       Current Medications (verified) Outpatient Encounter Medications as of 06/05/2022  Medication Sig   Cholecalciferol 1.25 MG (50000 UT) TABS Take 1 tablet by mouth once a week.   gabapentin (NEURONTIN) 300 MG capsule Take 300 mg by mouth as needed.   meloxicam (MOBIC) 7.5 MG tablet Take 1 tablet (7.5 mg total) by mouth daily. (Patient taking differently: Take 7.5 mg by mouth as needed for pain.)   ondansetron (ZOFRAN) 4 MG  tablet Take 4 mg by mouth every 8 (eight) hours as needed.   traMADol (ULTRAM) 50 MG tablet Take 1 tablet by mouth every 6 (six) hours as needed.   traZODone (DESYREL) 50 MG tablet TAKE 1 TABLET BY MOUTH EVERY DAY AT BEDTIME AS NEEDED FOR SLEEP   Vitamin D, Ergocalciferol, (DRISDOL) 1.25 MG (50000 UNIT) CAPS capsule Take 50,000 Units by mouth once a week.   [DISCONTINUED] escitalopram (LEXAPRO) 10 MG tablet TAKE 1 TABLET BY MOUTH EVERY DAY   escitalopram (LEXAPRO) 20 MG tablet Take 1 tablet (20 mg total) by mouth daily.   No facility-administered encounter medications on file as of 06/05/2022.    Allergies (verified) Metronidazole   History: No past medical history on file. Past Surgical History:  Procedure Laterality Date   ABDOMINAL HYSTERECTOMY     Family History  Problem Relation Age of Onset   Breast cancer Paternal Aunt    Bursitis Mother    Arthritis Mother    Skin cancer Father    Lung cancer Father        nonsmoker   Lung cancer Paternal Grandfather        nonsmoker   Social History   Socioeconomic History   Marital status: Widowed    Spouse name: Not on file   Number of children: 2   Years of education: some college  Highest education level: Not on file  Occupational History   Not on file  Tobacco Use   Smoking status: Former    Packs/day: 0.10    Years: 8.00    Total pack years: 0.80    Types: Cigarettes    Quit date: 2012    Years since quitting: 11.7   Smokeless tobacco: Never  Vaping Use   Vaping Use: Never used  Substance and Sexual Activity   Alcohol use: Yes    Comment: twice a month, one drink   Drug use: No   Sexual activity: Not Currently  Other Topics Concern   Not on file  Social History Narrative   03/29/20   From: the area   Living: with her 56 year old grandson in a townhouse   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, children's ([place], [place]) and [employer] ([place]), lives with [employer] (grandson  that lives with her) and has 5 other grandchildren      Enjoys: bowling, being with family, singing, goes to church      Exercise: doing PT, [employer] member   Diet: not great, drinks coke more than she should, skips breakfast      Safety   Seat belts: Yes    Guns: No   Safe in relationships: Yes    Social Determinants of Corporate investment banker Strain: Not on file  Food Insecurity: Not on file  Transportation Needs: Not on file  Physical Activity: Not on file  Stress: Not on file  Social Connections: Not on file    Tobacco Counseling Counseling given: Not Answered   Clinical Intake:  Pre-visit preparation completed: No  Pain : 0-10 Pain Score: 6  Pain Type: Neuropathic pain, Chronic pain Pain Location: Leg Pain Orientation: Right Pain Descriptors / Indicators: Shooting Pain Relieving Factors: physical therapy exercises  Pain Relieving Factors: physical therapy exercises  BMI - recorded: 20.34 Nutritional Status: BMI of 19-24  Normal Nutritional Risks: None Diabetes: No  How often do you need to have someone help you when you read instructions, pamphlets, or other written materials from your doctor or pharmacy?: 1 - Never  Diabetic?no  Interpreter Needed?: No      Activities of Daily Living    06/05/2022   11:24 AM 09/28/2021   11:21 AM  In your present state of health, do you have any difficulty performing the following activities:  Hearing? 0 0  Vision? 1 1  Comment following with eye doctor   Difficulty concentrating or making decisions? 1 1  Walking or climbing stairs? 0 0  Dressing or bathing? 0 0  Doing errands, shopping? 0 0  Preparing Food and eating ? N   Using the Toilet? N   In the past six months, have you accidently leaked urine? N   Do you have problems with loss of bowel control? N   Managing your Medications? N   Managing your Finances? N   Housekeeping or managing your Housekeeping? N     Patient Care Team: Lynnda Child, MD  as PCP - General (Family Medicine)  Indicate any recent Medical Services you may have received from other than [facility] providers in the past year (date may be approximate).     Assessment:   This is a routine wellness examination for [name].  Hearing/Vision screen No results found.  Dietary issues and exercise activities discussed: Current Exercise Habits: Home exercise routine, Type of exercise:  stretching;strength training/weights (physical therpay exercises), Time (Minutes): 20, Frequency (Times/Week): 3, Weekly Exercise (Minutes/Week): 60, Intensity: Mild, Exercise limited by: orthopedic condition(s)   Goals Addressed             This Visit's Progress    Patient Stated       Eat healthier      Depression Screen    06/05/2022   11:46 AM 09/28/2021   11:19 AM 05/09/2021   10:27 AM 03/29/2020   11:01 AM  PHQ 2/9 Scores  PHQ - 2 Score 5 3 4 3   PHQ- 9 Score 16 13 11 9     Fall Risk    06/05/2022   11:14 AM 05/09/2021    9:36 AM 03/29/2020    9:46 AM  Fall Risk   Falls in the past year? 0 0 0  Number falls in past yr: 0 0     FALL RISK PREVENTION PERTAINING TO THE HOME:  Any stairs in or around the home? Yes  If so, are there any without handrails? Yes  Home free of loose throw rugs in walkways, pet beds, electrical cords, etc? Yes  Adequate lighting in your home to reduce risk of falls? Yes   ASSISTIVE DEVICES UTILIZED TO PREVENT FALLS:  Life alert? No  Use of a cane, walker or w/c? No  Grab bars in the bathroom? No  Shower chair or bench in shower? No  Elevated toilet seat or a handicapped toilet? No   Cognitive Function:        Mini-Cog - 06/05/22 1126     Normal clock drawing test? yes    How many words correct? 1              Immunizations Immunization History  Administered Date(s) Administered   Fluad Quad(high Dose 65+) 06/05/2022   Janssen (J&J) SARS-COV-2 Vaccination 12/28/2019   PFIZER(Purple Top)SARS-COV-2 Vaccination 11/03/2020    PPD Test 10/30/2021   Pneumococcal Conjugate-13 05/21/2018   Pneumococcal Polysaccharide-23 06/05/2022    TDAP status: Due, Education has been provided regarding the importance of this vaccine. Advised may receive this vaccine at local pharmacy or Health Dept. Aware to provide a copy of the vaccination record if obtained from local pharmacy or Health Dept. Verbalized acceptance and understanding.  Flu Vaccine status: Completed at today's visit  Pneumococcal vaccine status: Due, Education has been provided regarding the importance of this vaccine. Advised may receive this vaccine at local pharmacy or Health Dept. Aware to provide a copy of the vaccination record if obtained from local pharmacy or Health Dept. Verbalized acceptance and understanding.  Covid-19 vaccine status: Information provided on how to obtain vaccines.   Qualifies for Shingles Vaccine? Yes   Zostavax completed No   Shingrix Completed?: No.    Education has been provided regarding the importance of this vaccine. Patient has been advised to call insurance company to determine out of pocket expense if they have not yet received this vaccine. Advised may also receive vaccine at local pharmacy or Health Dept. Verbalized acceptance and understanding.  Screening Tests Health Maintenance  Topic Date Due   TETANUS/TDAP  Never done   COLONOSCOPY (Pts 45-62yrs Insurance coverage will need to be confirmed)  Never done   Zoster Vaccines- Shingrix (1 of 2) Never done   COVID-19 Vaccine (3 - Booster for Janssen series) 12/29/2020   MAMMOGRAM  01/26/2024   Pneumonia Vaccine 2+ Years old  Completed   INFLUENZA VACCINE  Completed   DEXA SCAN  Completed  Hepatitis C Screening  Completed   HPV VACCINES  Aged Out    Health Maintenance  Health Maintenance Due  Topic Date Due   TETANUS/TDAP  Never done   COLONOSCOPY (Pts 45-82yrs Insurance coverage will need to be confirmed)  Never done   Zoster Vaccines- Shingrix (1 of 2) Never  done   COVID-19 Vaccine (3 - Booster for Janssen series) 12/29/2020    Colorectal cancer screening: Type of screening: Colonoscopy. Completed unknown. Repeat every 10 years  Mammogram status: Completed 01/2022. Repeat every year  Bone Density status: Completed 01/25/2022. Results reflect: Bone density results: OSTEOPOROSIS. Repeat every 2 years.  Lung Cancer Screening: (Low Dose CT Chest recommended if Age 65-80 years, 30 pack-year currently smoking OR have quit w/in 15years.) does not qualify.   Social History   Tobacco Use  Smoking Status Former   Packs/day: 0.10   Years: 8.00   Total pack years: 0.80   Types: Cigarettes   Quit date: 2012   Years since quitting: 11.7  Smokeless Tobacco Never     Lung Cancer Screening Referral: n/a  Additional Screening:  Hepatitis C Screening: does not qualify; not done  Vision Screening: Recommended annual ophthalmology exams for early detection of glaucoma and other disorders of the eye. Is the patient up to date with their annual eye exam?  Yes   Dental Screening: Recommended annual dental exams for proper oral hygiene  Community Resource Referral / Chronic Care Management: CRR required this visit?  No   CCM required this visit?  No      Plan:     Problem List Items Addressed This Visit       Nervous and Auditory   Chronic bilateral low back pain with bilateral sciatica    She notes return of symptoms, referral back to physical therapy to work with them.  She has seen neurosurgery, advised follow-up if symptoms do not improve.      Relevant Medications   escitalopram (LEXAPRO) 20 MG tablet   Other Relevant Orders   Ambulatory referral to Physical Therapy     Other   Major depressive disorder, recurrent (HCC)    Still with some symptoms, increase Lexapro from 10 mg to 20 mg.      Relevant Medications   escitalopram (LEXAPRO) 20 MG tablet   Depression   Relevant Medications   escitalopram (LEXAPRO) 20 MG tablet    Other Visit Diagnoses     Encounter for Medicare annual wellness exam    -  Primary   Need for influenza vaccination       Relevant Orders   Flu Vaccine QUAD High Dose(Fluad) (Completed)   Screening for colon cancer       Relevant Orders   Ambulatory referral to Gastroenterology   Need for 23-polyvalent pneumococcal polysaccharide vaccine       Relevant Orders   Pneumococcal polysaccharide vaccine 23-valent greater than or equal to 2yo subcutaneous/IM (Completed)        I have personally reviewed and noted the following in the patient's chart:   Medical and social history Use of alcohol, tobacco or illicit drugs  Current medications and supplements including opioid prescriptions. Patient is not currently taking opioid prescriptions. Functional ability and status Nutritional status Physical activity Advanced directives List of other physicians Hospitalizations, surgeries, and ER visits in previous 12 months Vitals Screenings to include cognitive, depression, and falls Referrals and appointments  In addition, I have reviewed and discussed with patient certain preventive protocols, quality metrics, and  best practice recommendations. A written personalized care plan for preventive services as well as general preventive health recommendations were provided to patient.     Lesleigh Noe, MD   06/05/2022

## 2022-06-05 NOTE — Assessment & Plan Note (Signed)
Still with some symptoms, increase Lexapro from 10 mg to 20 mg.

## 2022-06-11 ENCOUNTER — Telehealth: Payer: Self-pay | Admitting: *Deleted

## 2022-06-11 ENCOUNTER — Other Ambulatory Visit: Payer: Self-pay | Admitting: *Deleted

## 2022-06-11 DIAGNOSIS — Z1211 Encounter for screening for malignant neoplasm of colon: Secondary | ICD-10-CM

## 2022-06-11 MED ORDER — NA SULFATE-K SULFATE-MG SULF 17.5-3.13-1.6 GM/177ML PO SOLN: 1.0000 | Freq: Once | ORAL | 0 refills | Status: AC

## 2022-06-11 NOTE — Telephone Encounter (Signed)
Gastroenterology Pre-Procedure Review  Request Date: 07/23/2022 Requesting Physician: Dr. Marius Ditch  PATIENT REVIEW QUESTIONS: The patient responded to the following health history questions as indicated:    1. Are you having any GI issues? no 2. Do you have a personal history of Polyps? no 3. Do you have a family history of Colon Cancer or Polyps? no 4. Diabetes Mellitus? no 5. Joint replacements in the past 12 months?no 6. Major health problems in the past 3 months?no 7. Any artificial heart valves, MVP, or defibrillator?no    MEDICATIONS & ALLERGIES:    Patient reports the following regarding taking any anticoagulation/antiplatelet therapy:   Plavix, Coumadin, Eliquis, Xarelto, Lovenox, Pradaxa, Brilinta, or Effient? no Aspirin? no  Patient confirms/reports the following medications:  Current Outpatient Medications  Medication Sig Dispense Refill   Cholecalciferol 1.25 MG (50000 UT) TABS Take 1 tablet by mouth once a week. 4 tablet 1   escitalopram (LEXAPRO) 20 MG tablet Take 1 tablet (20 mg total) by mouth daily. 90 tablet 0   gabapentin (NEURONTIN) 300 MG capsule Take 300 mg by mouth as needed.     meloxicam (MOBIC) 7.5 MG tablet Take 1 tablet (7.5 mg total) by mouth daily. (Patient taking differently: Take 7.5 mg by mouth as needed for pain.) 30 tablet 0   ondansetron (ZOFRAN) 4 MG tablet Take 4 mg by mouth every 8 (eight) hours as needed.     traMADol (ULTRAM) 50 MG tablet Take 1 tablet by mouth every 6 (six) hours as needed.     traZODone (DESYREL) 50 MG tablet TAKE 1 TABLET BY MOUTH EVERY DAY AT BEDTIME AS NEEDED FOR SLEEP 90 tablet 0   Vitamin D, Ergocalciferol, (DRISDOL) 1.25 MG (50000 UNIT) CAPS capsule Take 50,000 Units by mouth once a week.     No current facility-administered medications for this visit.    Patient confirms/reports the following allergies:  Allergies  Allergen Reactions   Metronidazole Rash    No orders of the defined types were placed in this  encounter.   AUTHORIZATION INFORMATION Primary Insurance: 1D#: Group #:  Secondary Insurance: 1D#: Group #:  SCHEDULE INFORMATION: Date: 07/23/2022 Time: Location: [place]

## 2022-06-18 ENCOUNTER — Ambulatory Visit (INDEPENDENT_AMBULATORY_CARE_PROVIDER_SITE_OTHER): Payer: Medicare HMO | Admitting: Family Medicine

## 2022-06-18 ENCOUNTER — Telehealth: Payer: Self-pay | Admitting: *Deleted

## 2022-06-18 ENCOUNTER — Encounter: Payer: Self-pay | Admitting: Family Medicine

## 2022-06-18 VITALS — BP 90/50 | HR 92 | Temp 99.5°F | Ht 62.25 in | Wt 109.5 lb

## 2022-06-18 DIAGNOSIS — J208 Acute bronchitis due to other specified organisms: Secondary | ICD-10-CM

## 2022-06-18 DIAGNOSIS — R051 Acute cough: Secondary | ICD-10-CM | POA: Diagnosis not present

## 2022-06-18 LAB — POC INFLUENZA A&B (BINAX/QUICKVUE)
Influenza A, POC: NEGATIVE
Influenza B, POC: NEGATIVE

## 2022-06-18 LAB — POC COVID19 BINAXNOW: SARS Coronavirus 2 Ag: NEGATIVE

## 2022-06-18 MED ORDER — MAGIC MOUTHWASH: ORAL | 0 refills | Status: DC

## 2022-06-18 MED ORDER — DOXYCYCLINE HYCLATE 100 MG PO TABS: 100.0000 mg | ORAL_TABLET | Freq: Two times a day (BID) | ORAL | 0 refills | Status: DC

## 2022-06-18 NOTE — Telephone Encounter (Signed)
Duplicate/error

## 2022-06-18 NOTE — Progress Notes (Signed)
Mercedes Mcintosh T. Mercedes Sandner, MD, [name], [address]  Phone: [phone]  FAX: [name] - 71 y.o. female  MRN [id number]  Date of Birth: 17-Sep-1951  Date: 06/18/2022  PCP: Lesleigh Noe, MD  Referral: Lesleigh Noe, MD  Chief Complaint  Patient presents with   Cough   Nasal Congestion        Headache   Ear Pain   Generalized Body Aches        Subjective:   Mercedes Mcintosh is a 71 y.o. very pleasant female patient with Body mass index is 19.87 kg/m. who presents with the following:  Starting Friday, the patient has had some severe generalized body aches, headache, sore throat, nasal congestion, cough as well as an earache.  She globally feels very poorly, and she had onset of symptoms at that time, and nothing has relieved her overall state of health since then.  Started out and coughing severely and gotten worse Severe headache, sore throat, ears are hurting some with body aches and pain.   She denies any GI complaints including no nausea, vomiting, diarrhea.  Unclear of any potential sick contacts including unclear if there is been a COVID contact.  She is eating and drinking, slightly diminished.  Normal urination.  Review of Systems is noted in the HPI, as appropriate  Objective:   BP (!) 90/50   Pulse 92   Temp 99.5 F (37.5 C) (Oral)   Ht 5' 2.25" (1.581 m)   Wt 109 lb 8 oz (49.7 kg)   SpO2 95%   BMI 19.87 kg/m    Gen: WDWN, cooperative. Globally Non-toxic HEENT: Normocephalic and atraumatic. Throat clear, w/o exudate, R TM clear, L TM - good landmarks, No fluid present. rhinnorhea. No frontal or maxillary sinus T. MMM NECK: Anterior cervical  LAD is present CV: RRR, No M/G/R, cap refill <2 sec PULM: Breathing comfortably in no respiratory distress. no wheezing, crackles, rhonchi ABD: S,NT,ND,+BS. No HSM. No rebound. MSK: Nml gait   Laboratory  and Imaging Data: Results for orders placed or performed in visit on 06/18/22  POC COVID-19  Result Value Ref Range   SARS Coronavirus 2 Ag Negative Negative  POC Influenza A&B (Binax test)  Result Value Ref Range   Influenza A, POC Negative Negative   Influenza B, POC Negative Negative     Assessment and Plan:     ICD-10-CM   1. Acute bronchitis due to other specified organisms  J20.8     2. Acute cough  R05.1 POC COVID-19    POC Influenza A&B (Binax test)     Multiple symptoms including polyarthralgia, myalgia, cough, general malaise, sore throat, as well as severe fatigue.  While her COVID-19 test today was negative, this could be an early false negative.  I have asked her to retest on Wednesday, and if this comes back positive I would initiate antivirals.  Given the overall state of health, think is probably reasonable to have the patient start some doxycycline, she is struggling with a severe cough, so I will give her some Magic mouthwash for her severe sore throat.  Influenza testing was negative.  Medication Management during today's office visit: Meds ordered this encounter  Medications   doxycycline (VIBRA-TABS) 100 MG tablet    Sig: Take 1 tablet (100 mg total) by mouth 2 (two) times daily.    Dispense:  20 tablet  Refill:  0   magic mouthwash SOLN    Sig: maalox 80 ml benadryl susp, 80 mL lidocaine 1% 80 ml.  1 tsp po gargle q 4 hours prn sore throat    Dispense:  240 mL    Refill:  0   Medications Discontinued During This Encounter  Medication Reason   Cholecalciferol 1.25 MG (50000 UT) TABS Completed Course   meloxicam (MOBIC) 7.5 MG tablet Duplicate    Orders placed today for conditions managed today: Orders Placed This Encounter  Procedures   POC COVID-19   POC Influenza A&B (Binax test)    Disposition: No follow-ups on file.  Dragon [employer] One speech-to-text software was used for transcription in this dictation.  Possible transcriptional errors  can occur using Editor, commissioning.   Signed,  [name]. Zuhair Lariccia, MD   Outpatient Encounter Medications as of 06/18/2022  Medication Sig   doxycycline (VIBRA-TABS) 100 MG tablet Take 1 tablet (100 mg total) by mouth 2 (two) times daily.   escitalopram (LEXAPRO) 20 MG tablet Take 1 tablet (20 mg total) by mouth daily.   gabapentin (NEURONTIN) 300 MG capsule Take 300 mg by mouth as needed.   magic mouthwash SOLN maalox 80 ml benadryl susp, 80 mL lidocaine 1% 80 ml.  1 tsp po gargle q 4 hours prn sore throat   meloxicam (MOBIC) 7.5 MG tablet Take 7.5 mg by mouth daily as needed for pain.   ondansetron (ZOFRAN) 4 MG tablet Take 4 mg by mouth every 8 (eight) hours as needed.   traMADol (ULTRAM) 50 MG tablet Take 1 tablet by mouth every 6 (six) hours as needed.   traZODone (DESYREL) 50 MG tablet TAKE 1 TABLET BY MOUTH EVERY DAY AT BEDTIME AS NEEDED FOR SLEEP   Vitamin D, Ergocalciferol, (DRISDOL) 1.25 MG (50000 UNIT) CAPS capsule Take 50,000 Units by mouth once a week.   [DISCONTINUED] Cholecalciferol 1.25 MG (50000 UT) TABS Take 1 tablet by mouth once a week.   [DISCONTINUED] meloxicam (MOBIC) 7.5 MG tablet Take 1 tablet (7.5 mg total) by mouth daily. (Patient taking differently: Take 7.5 mg by mouth as needed for pain.)   No facility-administered encounter medications on file as of 06/18/2022.

## 2022-06-18 NOTE — Telephone Encounter (Signed)
Patient left a voicemail stating that she would like to reschedule there colonoscopy. Her procedure is on 07/23/2022.  Voicemail message left for patient to return my call.

## 2022-06-20 NOTE — Telephone Encounter (Signed)
Voicemail message left for patient to return my call.  

## 2022-06-21 NOTE — Telephone Encounter (Signed)
Voicemail message left for patient to return my call.  

## 2022-07-23 ENCOUNTER — Telehealth: Payer: Self-pay

## 2022-07-23 ENCOUNTER — Ambulatory Visit: Admission: RE | Admit: 2022-07-23 | Payer: Medicare HMO | Source: Home / Self Care | Admitting: Gastroenterology

## 2022-07-23 ENCOUNTER — Encounter: Admission: RE | Payer: Self-pay | Source: Home / Self Care

## 2022-07-23 SURGERY — COLONOSCOPY WITH PROPOFOL
Anesthesia: General

## 2022-07-23 NOTE — Telephone Encounter (Signed)
Patient was scheduled for her colonoscopy today with Dr. Marius Ditch.  She called 06/18/22 to reschedule her screening colonoscopy with Dr. Marius Ditch.  Vallarie Mare, CMA left voice message with her to reschedule however she did not call back.  I lvm for her to call back this morning to reschedule.   Thanks,  [place], [place]

## 2022-07-30 ENCOUNTER — Telehealth: Payer: Self-pay

## 2022-07-30 NOTE — Telephone Encounter (Signed)
Please call patient to set up [facility] for patient.

## 2022-07-30 NOTE — Telephone Encounter (Signed)
Lvm for patient to call and schedule 

## 2022-08-13 NOTE — Telephone Encounter (Signed)
Please call patient to see if we can set up TOC

## 2022-09-04 DIAGNOSIS — M542 Cervicalgia: Secondary | ICD-10-CM | POA: Diagnosis not present

## 2022-09-04 DIAGNOSIS — M545 Low back pain, unspecified: Secondary | ICD-10-CM | POA: Diagnosis not present

## 2022-09-11 DIAGNOSIS — M545 Low back pain, unspecified: Secondary | ICD-10-CM | POA: Diagnosis not present

## 2022-09-11 DIAGNOSIS — M542 Cervicalgia: Secondary | ICD-10-CM | POA: Diagnosis not present

## 2022-09-13 ENCOUNTER — Telehealth: Payer: Self-pay

## 2022-09-13 DIAGNOSIS — F3289 Other specified depressive episodes: Secondary | ICD-10-CM

## 2022-09-13 MED ORDER — ESCITALOPRAM OXALATE 20 MG PO TABS: 20.0000 mg | ORAL_TABLET | Freq: Every day | ORAL | 0 refills | Status: DC

## 2022-09-13 NOTE — Telephone Encounter (Signed)
Received refill request for       TOC set up with [facility] on 10/02/2022

## 2022-09-13 NOTE — Addendum Note (Signed)
Addended by: Eden Emms on: 09/13/2022 05:15 PM   Modules accepted: Orders

## 2022-09-13 NOTE — Telephone Encounter (Signed)
Medication called in 

## 2022-10-02 ENCOUNTER — Encounter: Payer: Self-pay | Admitting: Nurse Practitioner

## 2022-10-02 ENCOUNTER — Ambulatory Visit (INDEPENDENT_AMBULATORY_CARE_PROVIDER_SITE_OTHER): Payer: Medicare HMO | Admitting: Nurse Practitioner

## 2022-10-02 VITALS — BP 92/58 | HR 82 | Ht 62.25 in | Wt 113.0 lb

## 2022-10-02 DIAGNOSIS — F331 Major depressive disorder, recurrent, moderate: Secondary | ICD-10-CM

## 2022-10-02 DIAGNOSIS — M5442 Lumbago with sciatica, left side: Secondary | ICD-10-CM

## 2022-10-02 DIAGNOSIS — M5441 Lumbago with sciatica, right side: Secondary | ICD-10-CM

## 2022-10-02 DIAGNOSIS — G44219 Episodic tension-type headache, not intractable: Secondary | ICD-10-CM | POA: Diagnosis not present

## 2022-10-02 DIAGNOSIS — G8929 Other chronic pain: Secondary | ICD-10-CM

## 2022-10-02 DIAGNOSIS — M810 Age-related osteoporosis without current pathological fracture: Secondary | ICD-10-CM

## 2022-10-02 DIAGNOSIS — F5101 Primary insomnia: Secondary | ICD-10-CM | POA: Diagnosis not present

## 2022-10-02 NOTE — Assessment & Plan Note (Signed)
Secondary headache from muscle tightness/stress.  Patient has a muscle relaxer at home she is to call back and let me know the name of milligrams of medication prior to taking it.  She is working with physical therapy and sometimes I will give her massages in that area for her report.

## 2022-10-02 NOTE — Patient Instructions (Addendum)
Nice to see you today Take the escitalopram every day until I see you again Call the office and let me know what the name and mg of the muscle relaxer is I was to see you in 3.5 months for your physical, sooner if you need me  Call the GI office and get your colonoscopy set up  Address: [address], [place], [place] [address] Hours:  Open ? Closes 5?PM Phone: [phone]

## 2022-10-02 NOTE — Progress Notes (Signed)
Established Patient Office Visit  Subjective   Patient ID: Mercedes Mcintosh, female    DOB: 1950/11/18  Age: 72 y.o. MRN: [id number]  Chief Complaint  Patient presents with   Establish Care    HPI  Transfer of care: last seen by Owens Loffler, MD for an acute visit. Last Medicare wellness was 06/05/2022   Insomnia: currently managed on trazodone. States that she does well with the medication and get 7-8 hours of sleep. States that she feels rested.   Depression: Patient currently on lexapro. No HI/Si/AVH. She asked if I could go up on the MG. She is taking medication approx once a week, not consistent   Osteoporosis: last dexa scan was 01/25/2022 that show osteoporosis.  States that she is going to PT and states that her back is getting worse. [facility] states that she has been going on and off 1.5 years  Headaches: states that they start as soon as she gets up in the morning. States that she will take tylenol and ibuprofen. About every other day. States the spells are intermittent. Has been going on for 3-4 months. States that she has a muscle relaxer at home but is unsure of the name or mg  Mammo: 01/25/2022 Colonoscopy: had to cancel due to death in the family  Pap Smear: hysterectomy  Tdap: within 10 year Flu vaccine 05/2022 Covid: J&J with one pfizer booster PNA: utd Shingles: discussed in office     Review of Systems  Constitutional:  Negative for chills and fever.  Respiratory:  Negative for shortness of breath.   Cardiovascular:  Negative for chest pain.  Neurological:  Negative for headaches.  Psychiatric/Behavioral:  Negative for hallucinations and suicidal ideas.       Objective:     BP (!) 92/58   Pulse 82   Ht 5' 2.25" (1.581 m)   Wt 113 lb (51.3 kg)   SpO2 99%   BMI 20.50 kg/m    Physical Exam Vitals and nursing note reviewed.  Constitutional:      Appearance: Normal appearance.  HENT:     Right Ear: Tympanic membrane, ear canal and  external ear normal.     Left Ear: Tympanic membrane, ear canal and external ear normal.     Mouth/Throat:     Mouth: Mucous membranes are moist.     Pharynx: Oropharynx is clear.  Eyes:     Extraocular Movements: Extraocular movements intact.     Pupils: Pupils are equal, round, and reactive to light.     Comments: Wears glasses   Cardiovascular:     Rate and Rhythm: Normal rate and regular rhythm.     Pulses: Normal pulses.     Heart sounds: Normal heart sounds.  Pulmonary:     Effort: Pulmonary effort is normal.     Breath sounds: Normal breath sounds.  Musculoskeletal:       Arms:     Right lower leg: No edema.     Left lower leg: No edema.  Lymphadenopathy:     Cervical: No cervical adenopathy.  Skin:    General: Skin is warm.  Neurological:     General: No focal deficit present.     Mental Status: She is alert.     Deep Tendon Reflexes:     Reflex Scores:      Bicep reflexes are 2+ on the right side and 2+ on the left side.      Patellar reflexes are 2+ on the right  side and 2+ on the left side.    Comments: Bilateral upper and lower extremity strength 5/5  Psychiatric:        Mood and Affect: Mood normal.        Behavior: Behavior normal.        Thought Content: Thought content normal.        Judgment: Judgment normal.      No results found for any visits on 10/02/22.    The 10-year ASCVD risk score (Arnett DK, et al., 2019) is: 5.3%    Assessment & Plan:   Problem List Items Addressed This Visit       Nervous and Auditory   Chronic bilateral low back pain with bilateral sciatica     Patient has meloxicam and tramadol to use as needed.  States he has a muscle relaxer at home unsure of the name or milligram she supposed to get back with me prior to taking the medication.  She has been using [facility] and will continue to follow with them as it is beneficial per her report      Relevant Medications   escitalopram (LEXAPRO) 20 MG tablet      Musculoskeletal and Integument   Osteoporosis of forearm    Last DEXA scan May of last year.        Other   Major depressive disorder, recurrent ([place])    Patient currently maintained on escitalopram 20 mg daily.  She has not been taking the medication as prescribed states has been taking approximately once a week.  Told her this is ineffective she needs to take medication every day until we meet again before deciding about changing or adding on the therapy.  Patient denies HI/SI/AVH.      Relevant Medications   escitalopram (LEXAPRO) 20 MG tablet   Headache - Primary    Secondary headache from muscle tightness/stress.  Patient has a muscle relaxer at home she is to call back and let me know the name of milligrams of medication prior to taking it.  She is working with physical therapy and sometimes I will give her massages in that area for her report.      Relevant Medications   escitalopram (LEXAPRO) 20 MG tablet   Insomnia    Currently maintained with trazodone on a as needed basis.  Patient is tolerating medicine well seems to work.  Continue medication as prescribed       Return in about 14 weeks (around 01/08/2023) for CPE and Labs.    [name], NP

## 2022-10-02 NOTE — Assessment & Plan Note (Signed)
Last DEXA scan May of last year.

## 2022-10-02 NOTE — Assessment & Plan Note (Signed)
Patient currently maintained on escitalopram 20 mg daily.  She has not been taking the medication as prescribed states has been taking approximately once a week.  Told her this is ineffective she needs to take medication every day until we meet again before deciding about changing or adding on the therapy.  Patient denies HI/SI/AVH.

## 2022-10-02 NOTE — Assessment & Plan Note (Signed)
Currently maintained with trazodone on a as needed basis.  Patient is tolerating medicine well seems to work.  Continue medication as prescribed

## 2022-10-02 NOTE — Assessment & Plan Note (Signed)
Patient has meloxicam and tramadol to use as needed.  States he has a muscle relaxer at home unsure of the name or milligram she supposed to get back with me prior to taking the medication.  She has been using [facility] and will continue to follow with them as it is beneficial per her report

## 2022-10-03 ENCOUNTER — Telehealth: Payer: Self-pay | Admitting: Nurse Practitioner

## 2022-10-03 NOTE — Telephone Encounter (Signed)
Spoke with patient and advised on the Tizanidine. She states she normally takes 1 tablet. Advised on side effects of sleepiness/drowsiness/dizziness, she states she will take at night PRN.  Other rx requests:  Per pharmacy: Gabapentin last filled 10/05/21 #30, 0 refills [name]  *Patient reports takes PRN only Meloxicam last filled 11/2021 #30, 0 refills; still have rx on file from 12/05/21 #30 [name]  *Patient reports takes PRN only Escitalopram last filled 9/20023, #90, 0 refills; still have rx on file from 09/13/22 [name]  *Patient reports takes daily, aware refill on file at pharmacy  Per pharmacy patient is not taking these medications as directed.  Please advise on refill requests for Gabapentin, Meloxicam and Tizanidine.  Thank you!

## 2022-10-03 NOTE — Telephone Encounter (Signed)
Pt states she saw [facility] yesterday, 1/16, & was told to let him know the name of the muscle relaxer she was taking. Pt states she was taking, tizanidine hcl 4 mg. Pt is requesting for dosage to be increased if possible? Call back # [phone]  Prescription Request  10/03/2022  Is this a "Controlled Substance" medicine? No  LOV: 10/02/2022  What is the name of the medication or equipment? meloxicam (MOBIC) 7.5 MG tablet, gabapentin (NEURONTIN) 300 MG capsule & escitalopram (LEXAPRO) 20 MG tablet   Have you contacted your pharmacy to request a refill? No   Which pharmacy would you like this sent to?  [id number] [address] [place] [address] Phone: [phone] Fax: [phone]    Patient notified that their request is being sent to the clinical staff for review and that they should receive a response within 2 business days.   Please advise at Mobile [phone] (mobile)

## 2022-10-03 NOTE — Telephone Encounter (Signed)
She told me that she had not been taking the medication. She can take 0.5-1 tablet and see if that is effective. Be review that it can cause sedation and dizziness.

## 2022-10-04 MED ORDER — GABAPENTIN 300 MG PO CAPS: 300.0000 mg | ORAL_CAPSULE | ORAL | 0 refills | Status: DC | PRN

## 2022-10-04 MED ORDER — MELOXICAM 7.5 MG PO TABS: 7.5000 mg | ORAL_TABLET | Freq: Every day | ORAL | 0 refills | Status: DC | PRN

## 2022-10-04 NOTE — Telephone Encounter (Signed)
Refills provided.

## 2022-10-04 NOTE — Addendum Note (Signed)
Addended by: Michela Pitcher on: 10/04/2022 04:17 PM   Modules accepted: Orders

## 2022-10-05 MED ORDER — TIZANIDINE HCL 2 MG PO TABS: 2.0000 mg | ORAL_TABLET | Freq: Every evening | ORAL | 0 refills | Status: DC | PRN

## 2022-10-05 NOTE — Telephone Encounter (Signed)
Patient told me that she had tizanidine at home when we were in office. Please verify if not I can send in a lower dose tizanidine

## 2022-10-05 NOTE — Telephone Encounter (Signed)
Per patient she had 3 tabs left and they are out of date of Tizanidine.   Patient aware Escitalopram refill available and to contact pharmacy.  Please send Tizanidine, thanks!

## 2022-10-05 NOTE — Addendum Note (Signed)
Addended by: Michela Pitcher on: 10/05/2022 04:38 PM   Modules accepted: Orders

## 2022-10-05 NOTE — Telephone Encounter (Signed)
Medication sent in

## 2022-10-05 NOTE — Telephone Encounter (Signed)
Per pharmacy patient still has Escitalopram refill available.   Please advise on Tizanidine. Thanks!

## 2022-10-05 NOTE — Telephone Encounter (Signed)
Patient called and stated she still haven't received the medication Escitalopram and Tizanidine. She wanted those filled. Call back number [phone].

## 2022-12-04 IMAGING — CR DG CHEST 2V
1 series · 2 of 2 positions shown · non-contrast
Comparison: June 27, 2019

CLINICAL DATA: Cough and chest pain.

EXAM:
CHEST - 2 VIEW

[Series 1: dg chest 2 view · 0.14mm/px · 2 of 2 slices shown]
[im 1/2]
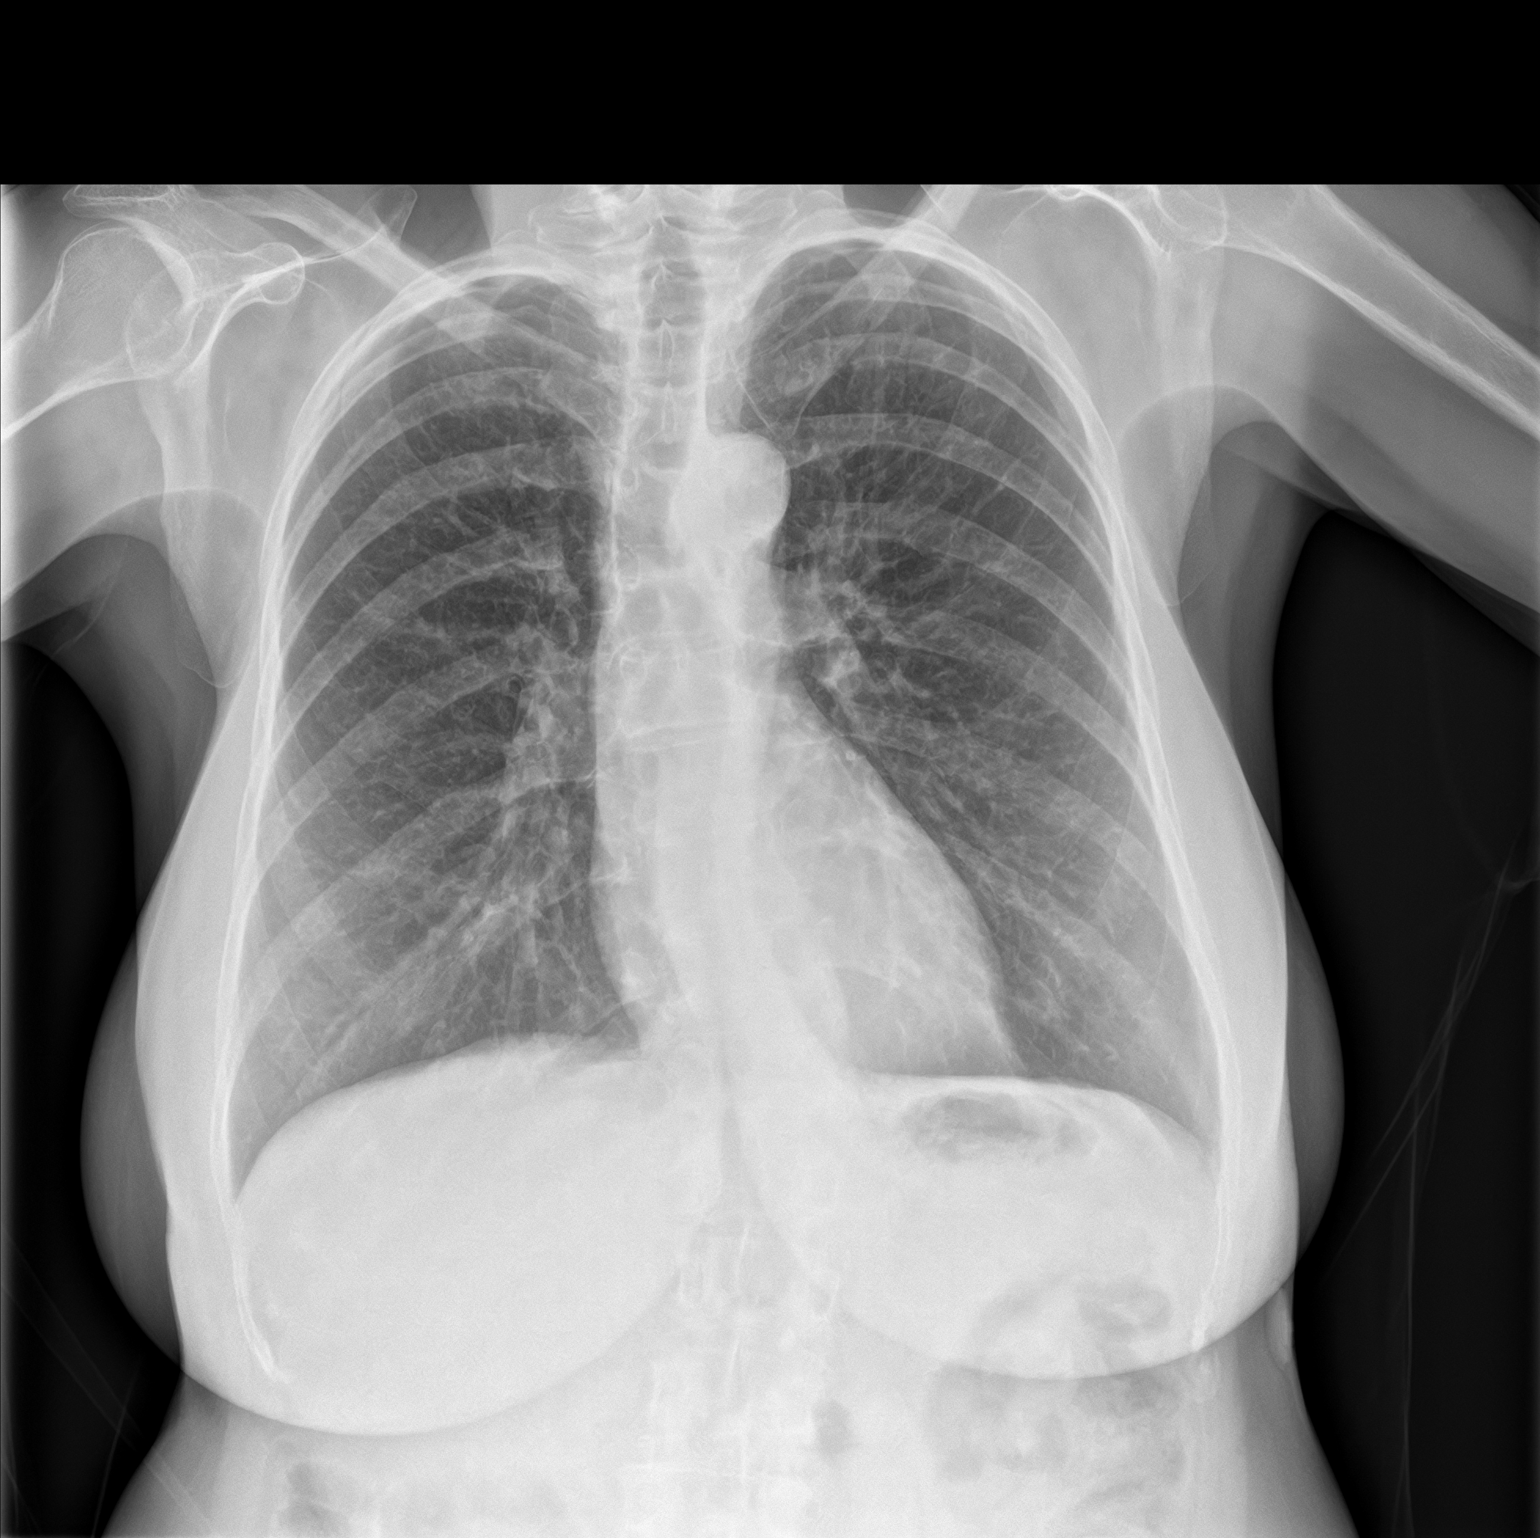
[im 2/2]
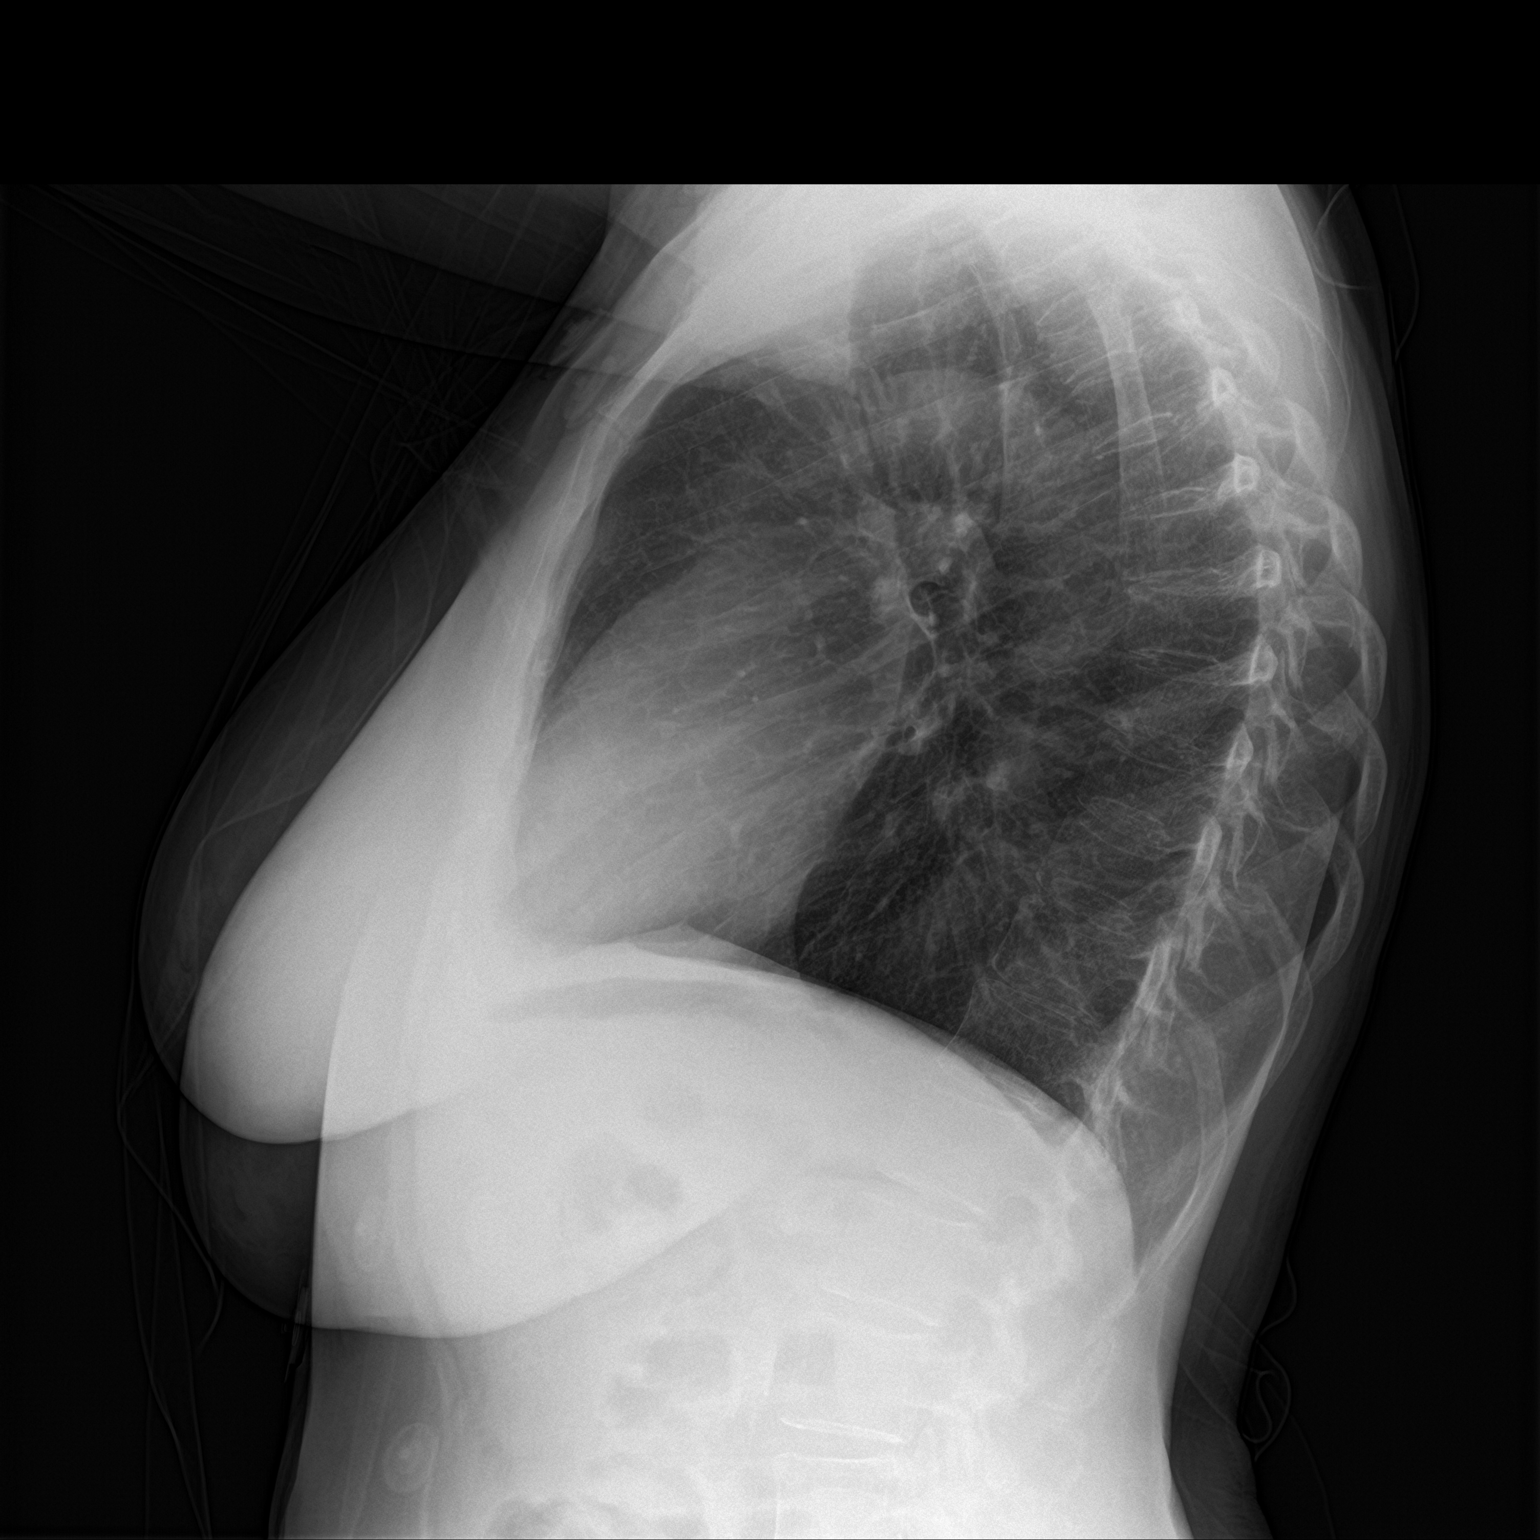

[2 of 2 positions shown; findings below may reference images not displayed]

FINDINGS: The heart size and mediastinal contours are within normal limits.
Both lungs are clear. There is mild, stable scoliosis of the lower
thoracic spine.
IMPRESSION: No active cardiopulmonary disease.

## 2022-12-26 ENCOUNTER — Other Ambulatory Visit: Payer: Self-pay | Admitting: Nurse Practitioner

## 2022-12-26 DIAGNOSIS — F3289 Other specified depressive episodes: Secondary | ICD-10-CM

## 2023-01-07 IMAGING — MR MR ABDOMEN WO/W CM
27 series · 31 of 31 positions shown · IV contrast (5ml Gadavist)
Comparison: No prior abdominal MRI. CT the abdomen and pelvis
08/25/2021.

CLINICAL DATA: 70-year-old female with history of multiple liver
lesions noted on prior CT examination. Follow-up study.

EXAM:
MRI ABDOMEN WITHOUT AND WITH CONTRAST
TECHNIQUE: Multiplanar multisequence MR imaging of the abdomen was performed
both before and after the administration of intravenous contrast.
CONTRAST:  5mL GADAVIST GADOBUTROL 1 MMOL/ML IV SOLN

[Series 3: T2 · coronal · 6.0mm · 1.19mm/px · 1 of 24 slices shown (1 of 2)]
[im 1/24]
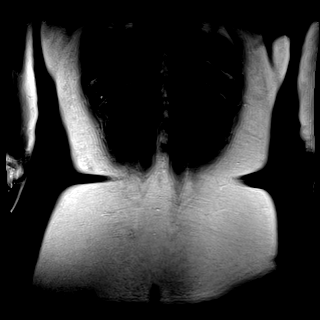

[Series 4: T2 · axial · 6.0mm · 1.19mm/px · 1 of 32 slices shown (2 of 2)]
[im 1/32]
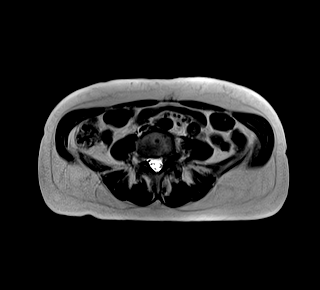

[Series 6: T2 fat-sat · axial · 6.0mm · 1.19mm/px · 1 of 34 slices shown]
[im 1/34]
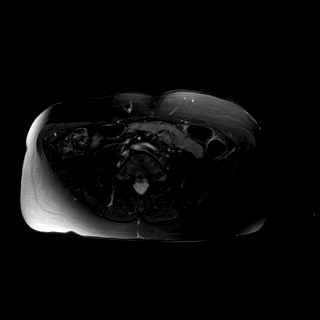

[Series 9: T1 · axial · 3.0mm · 1.19mm/px · 1 of 72 slices shown (1 of 4)]
[im 1/72]
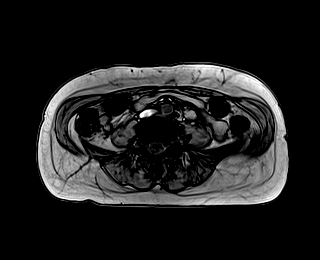

[Series 10: T1 · axial · 3.0mm · 1.19mm/px · 1 of 72 slices shown (2 of 4)]
[im 1/72]
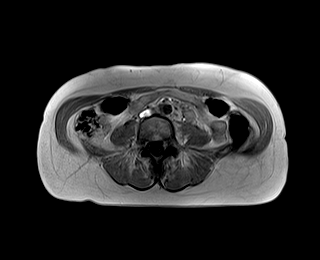

[Series 11: bSSFP · axial · 6.0mm · 0.74mm/px · 1 of 32 slices shown]
[im 1/32]
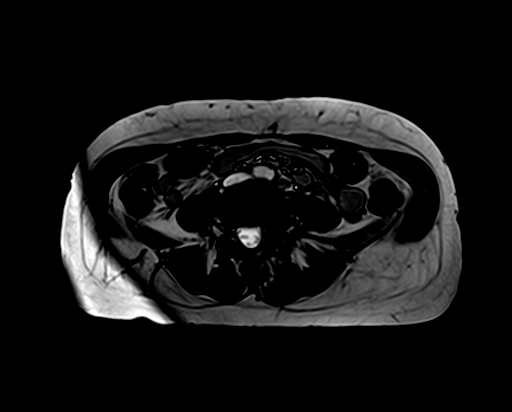

[Series 12: T1 dynamic fat-sat · axial · non-contrast · 3.0mm · 1.19mm/px · 1 of 80 slices shown (1 of 9)]
[im 1/80]
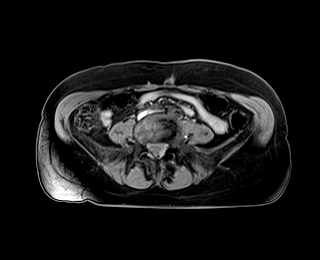

[Series 13: T1 dynamic fat-sat post-contrast · axial · 3.0mm · 1.19mm/px · 1 of 80 slices shown (1 of 7)]
[im 1/80]
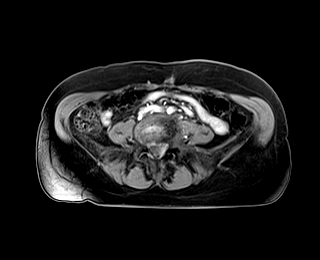

[Series 14: T1 dynamic fat-sat · axial · 3.0mm · 1.19mm/px · 1 of 80 slices shown (2 of 9)]
[im 1/80]
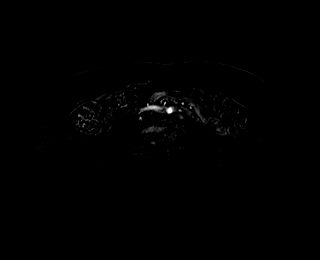

[Series 15: T1 dynamic fat-sat post-contrast · axial · 3.0mm · 1.19mm/px · 1 of 80 slices shown (2 of 7)]
[im 1/80]
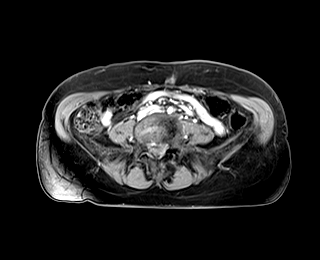

[Series 16: T1 dynamic fat-sat · axial · 3.0mm · 1.19mm/px · 1 of 80 slices shown (3 of 9)]
[im 1/80]
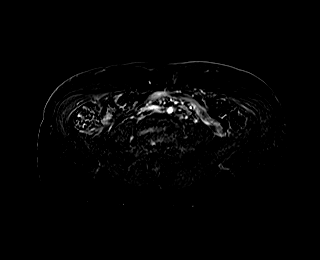

[Series 17: T1 dynamic fat-sat post-contrast · axial · 3.0mm · 1.19mm/px · 1 of 80 slices shown (3 of 7)]
[im 1/80]
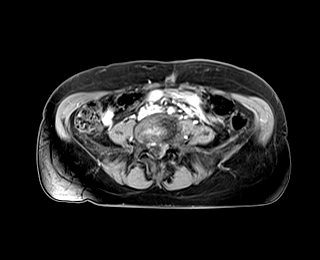

[Series 18: T1 dynamic fat-sat · axial · 3.0mm · 1.19mm/px · 1 of 80 slices shown (4 of 9)]
[im 1/80]
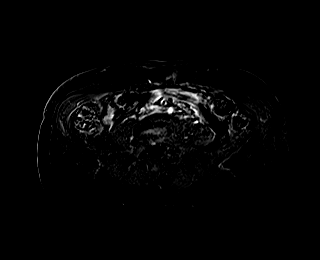

[Series 19: T1 dynamic post-contrast · coronal · 3.0mm · 1.31mm/px · 2 of 72 slices shown]
[im 1/72]
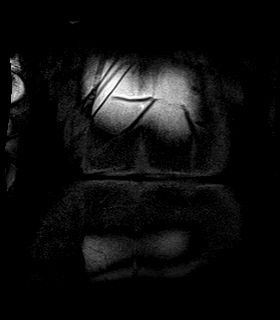
[im 72/72]
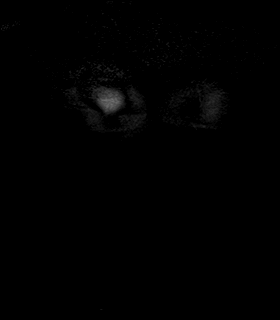

[Series 20: T1 dynamic fat-sat post-contrast · axial · 3.0mm · 1.19mm/px · z∈[-17,+220]mm · 2 of 80 slices shown (4 of 7)]
[im 1/80]
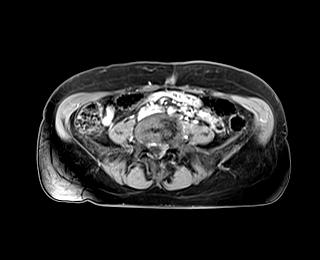
[im 80/80]
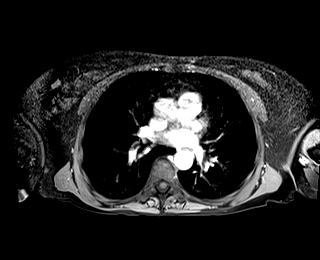

[Series 21: T1 dynamic fat-sat · axial · 3.0mm · 1.19mm/px · z∈[-17,+220]mm · 2 of 80 slices shown (5 of 9)]
[im 1/80]
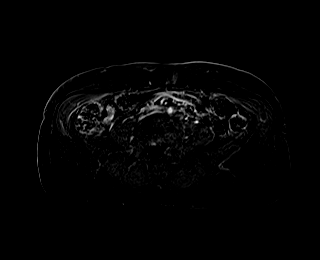
[im 80/80]
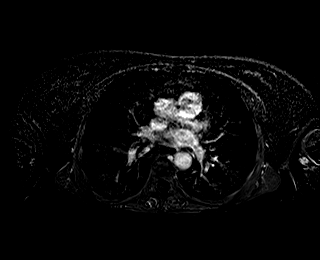

[Series 7001: ax dwi_tracew · axial · 6.0mm · 1.42mm/px · z∈[-31,+206]mm · 2 of 102 slices shown]
[im 1/102]
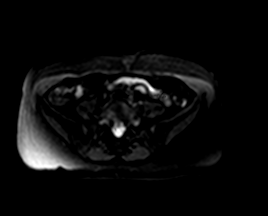
[im 102/102]
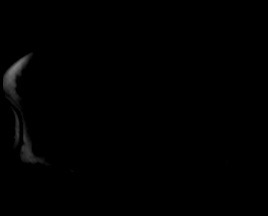

[Series 8001: ax dwi_adc · axial · 6.0mm · 1.42mm/px · 1 of 34 slices shown]
[im 1/34]
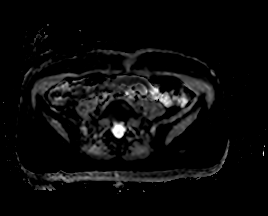

[Series 9001: T1 · axial · 3.0mm · 1.19mm/px · 1 of 72 slices shown (3 of 4)]
[im 72/72]
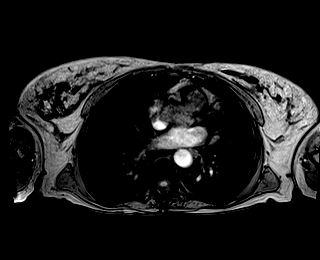

[T1 · axial · 3.0mm · 1.19mm/px · 1 of 72 slices shown (4 of 4)]
[im 72/72]
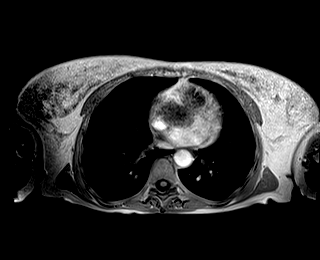

[T1 dynamic fat-sat · axial · non-contrast · 3.0mm · 1.19mm/px · 1 of 80 slices shown (6 of 9)]
[im 80/80]
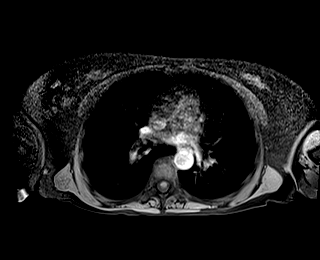

[T1 dynamic fat-sat post-contrast · axial · 3.0mm · 1.19mm/px · 1 of 80 slices shown (5 of 7)]
[im 80/80]
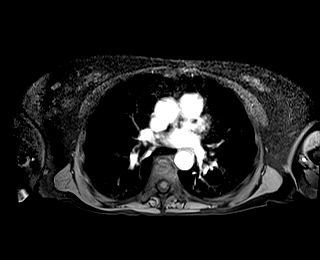

[T1 dynamic fat-sat · axial · 3.0mm · 1.19mm/px · 1 of 80 slices shown (7 of 9)]
[im 80/80]
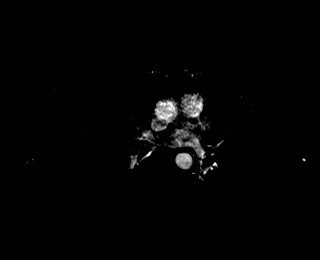

[T1 dynamic fat-sat post-contrast · axial · 3.0mm · 1.19mm/px · 1 of 80 slices shown (6 of 7)]
[im 80/80]
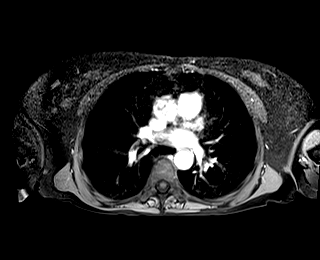

[T1 dynamic fat-sat · axial · 3.0mm · 1.19mm/px · 1 of 80 slices shown (8 of 9)]
[im 80/80]
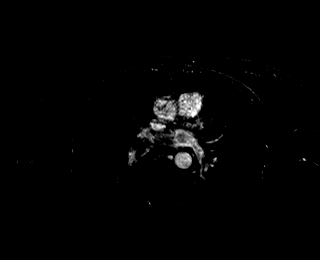

[T1 dynamic fat-sat post-contrast · axial · 3.0mm · 1.19mm/px · 1 of 80 slices shown (7 of 7)]
[im 80/80]
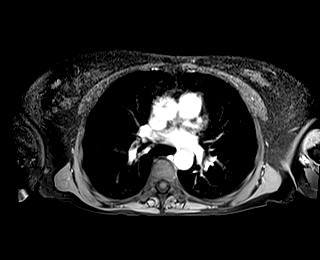

[T1 dynamic fat-sat · axial · 3.0mm · 1.19mm/px · 1 of 80 slices shown (9 of 9)]
[im 80/80]
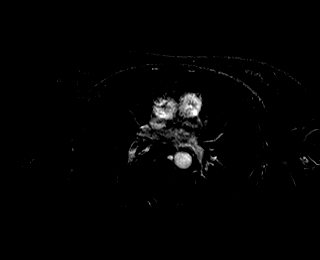

[31 of 31 positions shown; findings below may reference images not displayed]

FINDINGS: Lower chest: Unremarkable.

Hepatobiliary: Numerous T1 hypointense, T2 hyperintense,
nonenhancing lesions are scattered throughout the liver, compatible
with a combination of simple cysts and biliary hamartomas. The
largest simple cyst is in segment 2 of the liver (axial image 13 of
series 4) measuring 1.8 x 1.4 cm. No other aggressive appearing
hepatic lesions are noted. No intra or extrahepatic biliary ductal
dilatation. Common bile duct measures 6 mm in the porta hepatis.
Gallbladder is normal in appearance.

Pancreas: No pancreatic mass. No pancreatic ductal dilatation. No
pancreatic or peripancreatic fluid collections or inflammatory
changes.

Spleen:  Unremarkable.

Adrenals/Urinary Tract: Bilateral kidneys and bilateral adrenal
glands are normal in appearance. No hydroureteronephrosis in the
visualized portions of the abdomen.

Stomach/Bowel: Visualized portions are unremarkable.

Vascular/Lymphatic: No aneurysm identified in the visualized
abdominal vasculature. No lymphadenopathy noted in the abdomen.

Other: No significant volume of ascites noted in the visualized
portions of the peritoneal cavity.

Musculoskeletal: No aggressive appearing osseous lesions are noted
in the visualized portions of the skeleton.
IMPRESSION: 1. Previously noted liver lesions are compatible with a combination
of simple hepatic cysts and tiny subcentimeter cysts and/or biliary
hamartomas.

## 2023-01-14 ENCOUNTER — Telehealth: Payer: Self-pay | Admitting: Nurse Practitioner

## 2023-01-14 NOTE — Telephone Encounter (Signed)
Called patient to schedule Medicare Annual Wellness Visit (AWV). Left message for patient to call back and schedule Medicare Annual Wellness Visit (AWV).  Last date of AWV: 06/06/2023  Please schedule an appointment at any time with [facility] .  If any questions, please contact me at [phone].  Thank you ,  Randon Goldsmith Care Guide [facility] AWV TEAM Direct Dial: [phone]

## 2023-02-20 ENCOUNTER — Telehealth: Payer: Self-pay | Admitting: Nurse Practitioner

## 2023-02-20 NOTE — Telephone Encounter (Signed)
Pt want a TOC from cable to [employer]

## 2023-02-20 NOTE — Telephone Encounter (Signed)
Ok with me for TOC 

## 2023-02-22 ENCOUNTER — Ambulatory Visit: Payer: Medicare HMO | Admitting: Family Medicine

## 2023-02-26 ENCOUNTER — Ambulatory Visit (INDEPENDENT_AMBULATORY_CARE_PROVIDER_SITE_OTHER)
Admission: RE | Admit: 2023-02-26 | Discharge: 2023-02-26 | Disposition: A | Discharge status: Home / Self Care | Payer: Medicare HMO | Source: Ambulatory Visit | Attending: Family Medicine | Admitting: Family Medicine

## 2023-02-26 ENCOUNTER — Ambulatory Visit (INDEPENDENT_AMBULATORY_CARE_PROVIDER_SITE_OTHER): Payer: Medicare HMO | Admitting: Family Medicine

## 2023-02-26 ENCOUNTER — Encounter: Payer: Self-pay | Admitting: Family Medicine

## 2023-02-26 VITALS — BP 90/50 | HR 76 | Temp 98.5°F | Ht 62.25 in | Wt 116.5 lb

## 2023-02-26 DIAGNOSIS — M5441 Lumbago with sciatica, right side: Secondary | ICD-10-CM

## 2023-02-26 DIAGNOSIS — G8929 Other chronic pain: Secondary | ICD-10-CM

## 2023-02-26 DIAGNOSIS — F330 Major depressive disorder, recurrent, mild: Secondary | ICD-10-CM | POA: Diagnosis not present

## 2023-02-26 DIAGNOSIS — M5442 Lumbago with sciatica, left side: Secondary | ICD-10-CM

## 2023-02-26 DIAGNOSIS — F3289 Other specified depressive episodes: Secondary | ICD-10-CM

## 2023-02-26 DIAGNOSIS — M545 Low back pain, unspecified: Secondary | ICD-10-CM | POA: Diagnosis not present

## 2023-02-26 MED ORDER — ESCITALOPRAM OXALATE 20 MG PO TABS: 20.0000 mg | ORAL_TABLET | Freq: Every day | ORAL | 0 refills | Status: DC

## 2023-02-26 MED ORDER — MELOXICAM 7.5 MG PO TABS: 7.5000 mg | ORAL_TABLET | Freq: Every day | ORAL | 0 refills | Status: DC | PRN

## 2023-02-26 MED ORDER — TIZANIDINE HCL 2 MG PO TABS: 2.0000 mg | ORAL_TABLET | Freq: Every evening | ORAL | 0 refills | Status: DC | PRN

## 2023-02-26 NOTE — Assessment & Plan Note (Signed)
Chronic, well-controlled on Lexapro 20 mg daily.  Requip filled per patient request

## 2023-02-26 NOTE — Patient Instructions (Signed)
We will start with X-ray of low back.   Restart  physical therapy as planned.  Restart meloxicam 7.5 mg daily.  Can use muscle relaxant as needed for muscle spasm.  We will decide about restarting gabapentin once X-ray has returned.

## 2023-02-26 NOTE — Progress Notes (Signed)
Patient ID: Mercedes Mcintosh, female    DOB: June 24, 1951, 72 y.o.   MRN: [id number]  This visit was conducted in person.  BP (!) 90/50 (BP Location: Right Arm, Patient Position: Sitting, Cuff Size: Normal)   Pulse 76   Temp 98.5 F (36.9 C) (Temporal)   Ht 5' 2.25" (1.581 m)   Wt 116 lb 8 oz (52.8 kg)   SpO2 97%   BMI 21.14 kg/m    CC:  Chief Complaint  Patient presents with   Sciatica    Right    Subjective:   HPI: Mercedes Mcintosh is a 72 y.o. female patient of [facility], presenting on 02/26/2023 for Sciatica (Right)   History of chronic low back pain.  History of compression fracture T3 vertebrae, osteoporosis   Previously treated  with meloxicam and tramadol, muscle relaxant as needed     She is having a flare up of right sided sciatica, worsening over last 4-5 months.  Worse at night.  Pain across low back bilaterally, radiation to right leg.   Wearing back brace sometime.   She is starting  back PT   She is using  gabapentin 300 mg at night.  She is out of tizanadine.. does help.  On meloxicam   7.5 mg daily    No falls in last year. Relevant past medical, surgical, family and social history reviewed and updated as indicated. Interim medical history since our last visit reviewed. Allergies and medications reviewed and updated. Outpatient Medications Prior to Visit  Medication Sig Dispense Refill   gabapentin (NEURONTIN) 300 MG capsule Take 1 capsule (300 mg total) by mouth as needed. 30 capsule 0   traMADol (ULTRAM) 50 MG tablet Take 1 tablet by mouth every 6 (six) hours as needed.     traZODone (DESYREL) 50 MG tablet TAKE 1 TABLET BY MOUTH EVERY DAY AT BEDTIME AS NEEDED FOR SLEEP 90 tablet 0   escitalopram (LEXAPRO) 20 MG tablet TAKE 1 TABLET BY MOUTH EVERY DAY 90 tablet 0   meloxicam (MOBIC) 7.5 MG tablet Take 1 tablet (7.5 mg total) by mouth daily as needed for pain. (Patient not taking: Reported on 02/26/2023) 30 tablet 0   tiZANidine (ZANAFLEX) 2 MG tablet  Take 1 tablet (2 mg total) by mouth at bedtime as needed for muscle spasms. (Patient not taking: Reported on 02/26/2023) 15 tablet 0   No facility-administered medications prior to visit.     Per HPI unless specifically indicated in ROS section below Review of Systems  Constitutional:  Negative for fatigue and fever.  HENT:  Negative for congestion.   Eyes:  Negative for pain.  Respiratory:  Negative for cough and shortness of breath.   Cardiovascular:  Negative for chest pain, palpitations and leg swelling.  Gastrointestinal:  Negative for abdominal pain.  Genitourinary:  Negative for dysuria and vaginal bleeding.  Musculoskeletal:  Positive for back pain.  Neurological:  Negative for syncope, light-headedness and headaches.  Psychiatric/Behavioral:  Negative for dysphoric mood.    Objective:  BP (!) 90/50 (BP Location: Right Arm, Patient Position: Sitting, Cuff Size: Normal)   Pulse 76   Temp 98.5 F (36.9 C) (Temporal)   Ht 5' 2.25" (1.581 m)   Wt 116 lb 8 oz (52.8 kg)   SpO2 97%   BMI 21.14 kg/m   Wt Readings from Last 3 Encounters:  02/26/23 116 lb 8 oz (52.8 kg)  10/02/22 113 lb (51.3 kg)  06/18/22 109 lb 8 oz (49.7 kg)  Physical Exam Constitutional:      General: She is not in acute distress.    Appearance: Normal appearance. She is well-developed. She is not ill-appearing or toxic-appearing.  HENT:     Head: Normocephalic.     Right Ear: Hearing, tympanic membrane, ear canal and external ear normal. Tympanic membrane is not erythematous, retracted or bulging.     Left Ear: Hearing, tympanic membrane, ear canal and external ear normal. Tympanic membrane is not erythematous, retracted or bulging.     Nose: No mucosal edema or rhinorrhea.     Right Sinus: No maxillary sinus tenderness or frontal sinus tenderness.     Left Sinus: No maxillary sinus tenderness or frontal sinus tenderness.     Mouth/Throat:     Pharynx: Uvula midline.  Eyes:     General: Lids are  normal. Lids are everted, no foreign bodies appreciated.     Conjunctiva/sclera: Conjunctivae normal.     Pupils: Pupils are equal, round, and reactive to light.  Neck:     Thyroid: No thyroid mass or thyromegaly.     Vascular: No carotid bruit.     Trachea: Trachea normal.  Cardiovascular:     Rate and Rhythm: Normal rate and regular rhythm.     Pulses: Normal pulses.     Heart sounds: Normal heart sounds, S1 normal and S2 normal. No murmur heard.    No friction rub. No gallop.  Pulmonary:     Effort: Pulmonary effort is normal. No tachypnea or respiratory distress.     Breath sounds: Normal breath sounds. No decreased breath sounds, wheezing, rhonchi or rales.  Abdominal:     General: Bowel sounds are normal.     Palpations: Abdomen is soft.     Tenderness: There is no abdominal tenderness.  Musculoskeletal:     Cervical back: Normal range of motion and neck supple.     Lumbar back: Tenderness and bony tenderness present. No swelling. Decreased range of motion. Negative right straight leg raise test and negative left straight leg raise test.  Skin:    General: Skin is warm and dry.     Findings: No rash.  Neurological:     Mental Status: She is alert.     Cranial Nerves: Cranial nerves 2-12 are intact.     Sensory: Sensation is intact.     Coordination: Coordination is intact.     Gait: Gait is intact.  Psychiatric:        Mood and Affect: Mood is not anxious or depressed.        Speech: Speech normal.        Behavior: Behavior normal. Behavior is cooperative.        Thought Content: Thought content normal.        Judgment: Judgment normal.       Results for orders placed or performed in visit on 06/18/22  POC COVID-19  Result Value Ref Range   SARS Coronavirus 2 Ag Negative Negative  POC Influenza A&B (Binax test)  Result Value Ref Range   Influenza A, POC Negative Negative   Influenza B, POC Negative Negative    Assessment and Plan  Chronic bilateral low back  pain with right-sided sciatica -     DG Lumbar Spine Complete; Future  Other specified depressive episodes -     Escitalopram Oxalate; Take 1 tablet (20 mg total) by mouth daily.  Dispense: 90 tablet; Refill: 0  Chronic bilateral low back pain with bilateral sciatica Assessment &  Plan: Chronic, acute flare... not currently taking gabapentin, meloxicam or tizanadine.   We will start with X-ray of low back given none on record and history of osteoporosis  Restart  physical therapy as planned.  Restart meloxicam 7.5 mg daily.  Can use muscle relaxant as needed for muscle spasm.  We will decide about restarting gabapentin once X-ray has returned.   Mild episode of recurrent major depressive disorder ([facility]) Assessment & Plan: Chronic, well-controlled on Lexapro 20 mg daily.  Requip filled per patient request   Other orders -     Meloxicam; Take 1 tablet (7.5 mg total) by mouth daily as needed for pain.  Dispense: 30 tablet; Refill: 0 -     tiZANidine HCl; Take 1 tablet (2 mg total) by mouth at bedtime as needed for muscle spasms.  Dispense: 1530 tablet; Refill: 0    No follow-ups on file.   Kerby Nora, MD

## 2023-02-26 NOTE — Assessment & Plan Note (Addendum)
Chronic, acute flare... not currently taking gabapentin, meloxicam or tizanadine.   We will start with X-ray of low back given none on record and history of osteoporosis  Restart  physical therapy as planned.  Restart meloxicam 7.5 mg daily.  Can use muscle relaxant as needed for muscle spasm.  We will decide about restarting gabapentin once X-ray has returned.

## 2023-03-01 ENCOUNTER — Encounter: Payer: Self-pay | Admitting: Nurse Practitioner

## 2023-03-01 ENCOUNTER — Other Ambulatory Visit: Payer: Self-pay | Admitting: Family Medicine

## 2023-03-01 ENCOUNTER — Telehealth: Payer: Self-pay | Admitting: Nurse Practitioner

## 2023-03-01 DIAGNOSIS — F3289 Other specified depressive episodes: Secondary | ICD-10-CM

## 2023-03-01 MED ORDER — ESCITALOPRAM OXALATE 20 MG PO TABS: 20.0000 mg | ORAL_TABLET | Freq: Every day | ORAL | 0 refills | Status: DC

## 2023-03-01 MED ORDER — MELOXICAM 7.5 MG PO TABS: 7.5000 mg | ORAL_TABLET | Freq: Every day | ORAL | 0 refills | Status: DC | PRN

## 2023-03-01 MED ORDER — TIZANIDINE HCL 2 MG PO TABS: 2.0000 mg | ORAL_TABLET | Freq: Every evening | ORAL | 0 refills | Status: DC | PRN

## 2023-03-01 NOTE — Telephone Encounter (Signed)
Patient contacted the office regarding Xray results, would like a call back whenever possible to review them.

## 2023-03-01 NOTE — Telephone Encounter (Signed)
error 

## 2023-03-01 NOTE — Telephone Encounter (Signed)
Results still pending.

## 2023-03-01 NOTE — Telephone Encounter (Signed)
Called patient in detail.  Reviewed x-rays with her.  Discussed delay of radiology reading for final read.  We will contact her when we have the final read from radiology.  Refills of her medication resent to [facility] as she has stated they did not receive although they were sent on June 11 at her office visit.

## 2023-03-11 ENCOUNTER — Other Ambulatory Visit: Payer: Self-pay | Admitting: Family Medicine

## 2023-03-12 NOTE — Telephone Encounter (Signed)
Can we schedule the patient per my last office noted. Any 20 mins lot will work. Then send back for me to refill

## 2023-03-12 NOTE — Telephone Encounter (Signed)
Spoke to pt, scheduled f/u for 04/05/23

## 2023-04-05 ENCOUNTER — Ambulatory Visit (INDEPENDENT_AMBULATORY_CARE_PROVIDER_SITE_OTHER): Payer: Medicare HMO | Admitting: Nurse Practitioner

## 2023-04-05 ENCOUNTER — Encounter: Payer: Self-pay | Admitting: Nurse Practitioner

## 2023-04-05 VITALS — BP 110/62 | HR 72 | Temp 98.6°F | Ht 62.0 in | Wt 115.0 lb

## 2023-04-05 DIAGNOSIS — E7800 Pure hypercholesterolemia, unspecified: Secondary | ICD-10-CM | POA: Diagnosis not present

## 2023-04-05 DIAGNOSIS — E559 Vitamin D deficiency, unspecified: Secondary | ICD-10-CM

## 2023-04-05 DIAGNOSIS — F3341 Major depressive disorder, recurrent, in partial remission: Secondary | ICD-10-CM | POA: Diagnosis not present

## 2023-04-05 DIAGNOSIS — M5441 Lumbago with sciatica, right side: Secondary | ICD-10-CM

## 2023-04-05 DIAGNOSIS — G8929 Other chronic pain: Secondary | ICD-10-CM | POA: Diagnosis not present

## 2023-04-05 DIAGNOSIS — Z1231 Encounter for screening mammogram for malignant neoplasm of breast: Secondary | ICD-10-CM | POA: Diagnosis not present

## 2023-04-05 DIAGNOSIS — R7303 Prediabetes: Secondary | ICD-10-CM

## 2023-04-05 DIAGNOSIS — G4700 Insomnia, unspecified: Secondary | ICD-10-CM

## 2023-04-05 DIAGNOSIS — Z1211 Encounter for screening for malignant neoplasm of colon: Secondary | ICD-10-CM

## 2023-04-05 DIAGNOSIS — Z0000 Encounter for general adult medical examination without abnormal findings: Secondary | ICD-10-CM | POA: Diagnosis not present

## 2023-04-05 LAB — CBC
Hemoglobin: 13.1 g/dL (ref 11.7–15.5)
MCHC: 33.4 g/dL (ref 32.0–36.0)
MCV: 94 fL (ref 80.0–100.0)
RDW: 12.6 % (ref 11.0–15.0)

## 2023-04-05 MED ORDER — METHYLPREDNISOLONE 4 MG PO TBPK: ORAL_TABLET | ORAL | 0 refills | Status: DC

## 2023-04-05 NOTE — Assessment & Plan Note (Signed)
Trazodone at bedtime prn. Continue

## 2023-04-05 NOTE — Assessment & Plan Note (Signed)
History of the same in the setting of osteoporosis pending vitamin D level today.

## 2023-04-05 NOTE — Patient Instructions (Signed)
Nice to see you today Take the steroids with food and DO NOT take it with the meloxicam, advil, motrin, ibuprofen. Aleve or narpxoen. Follow up with me in 6 months to recheck on things, sooner if you need em

## 2023-04-05 NOTE — Assessment & Plan Note (Signed)
Hx of the same. Pending lipid panel

## 2023-04-05 NOTE — Progress Notes (Signed)
Established Patient Office Visit  Subjective   Patient ID: Mercedes Mcintosh, female    DOB: [birth year]  Age: 72 y.o. MRN: [id number]  Chief Complaint  Patient presents with   Back Pain    Increased medication not helping. Needs to have PT started back up       for complete physical and follow up of chronic conditions.  MDD: state sthat her mood does ok. NO Hi/SI/AVH. States that she does have a lot of family stuff going on and that has made her feel more depressed. She also does not like to be around groups of people for an extended period of time   Insomnia: state that she is on trazodone for sleep. States that she is taking it on PRN. Twice a week and does help   Back pain: hurts all the time. States that is it intermittently. States that it is worse with sitting still. Laying in bed hurts too.  Will take gabapentin 300mg  at bedtime also   Immunizations: -Tetanus: Completed in unsure  -Influenza: Completed this season -Shingles: utd  -Pneumonia: Completed   Diet: Fair diet. States that she does not eat breakfast but will drink coffee. States small breakfast and then lunch and dinner. Water and sprite  Exercise: No regular exercise.  Eye exam: Completes annually . July 26 has glasses Dental exam: Completes semi-annually    Colonoscopy: amb referral for [place]  Lung Cancer Screening: NA. Former 0.25ppd smoker for 20 years  Pap smear: aged out/ hysterectomy   Mammogram: needs updating . Order placed today   Dexa: 01/2022. Osteoporosis and osteopenia         Review of Systems  Constitutional:  Negative for chills and fever.  Respiratory:  Negative for shortness of breath.   Cardiovascular:  Negative for chest pain and leg swelling.  Gastrointestinal:  Negative for abdominal pain, blood in stool, constipation, diarrhea, nausea and vomiting.       BM daily   Genitourinary:  Negative for dysuria and hematuria.  Musculoskeletal:  Positive for back pain.   Neurological:  Negative for tingling and headaches.  Psychiatric/Behavioral:  Negative for hallucinations and suicidal ideas.       Objective:     BP 110/62   Pulse 72   Temp 98.6 F (37 C) (Temporal)   Ht 5\' 2"  (1.575 m)   Wt 115 lb (52.2 kg)   SpO2 95%   BMI 21.03 kg/m  BP Readings from Last 3 Encounters:  04/05/23 110/62  02/26/23 (!) 90/50  10/02/22 (!) 92/58   Wt Readings from Last 3 Encounters:  04/05/23 115 lb (52.2 kg)  02/26/23 116 lb 8 oz (52.8 kg)  10/02/22 113 lb (51.3 kg)      Physical Exam Vitals and nursing note reviewed.  Constitutional:      Appearance: Normal appearance.  HENT:     Right Ear: Tympanic membrane, ear canal and external ear normal.     Left Ear: Tympanic membrane, ear canal and external ear normal.     Mouth/Throat:     Mouth: Mucous membranes are moist.     Pharynx: Oropharynx is clear.  Eyes:     Extraocular Movements: Extraocular movements intact.     Pupils: Pupils are equal, round, and reactive to light.  Cardiovascular:     Rate and Rhythm: Normal rate and regular rhythm.     Pulses: Normal pulses.     Heart sounds: Normal heart sounds.  Pulmonary:     Effort:  Pulmonary effort is normal.     Breath sounds: Normal breath sounds.  Abdominal:     General: Bowel sounds are normal. There is no distension.     Palpations: There is no mass.     Tenderness: There is no abdominal tenderness.     Hernia: No hernia is present.  Musculoskeletal:     Lumbar back: No tenderness or bony tenderness. Negative right straight leg raise test and negative left straight leg raise test.     Right lower leg: No edema.     Left lower leg: No edema.  Lymphadenopathy:     Cervical: No cervical adenopathy.  Skin:    General: Skin is warm.  Neurological:     General: No focal deficit present.     Mental Status: She is alert.     Deep Tendon Reflexes:     Reflex Scores:      Bicep reflexes are 2+ on the right side and 2+ on the left side.       Patellar reflexes are 2+ on the right side and 2+ on the left side.    Comments: Bilateral upper and lower extremity strength 5/5  Psychiatric:        Mood and Affect: Mood normal.        Behavior: Behavior normal.        Thought Content: Thought content normal.        Judgment: Judgment normal.      No results found for any visits on 04/05/23.    The ASCVD Risk score (Arnett DK, et al., 2019) failed to calculate for the following reasons:   Cannot find a previous HDL lab   Cannot find a previous total cholesterol lab    Assessment & Plan:   Problem List Items Addressed This Visit       Nervous and Auditory   Chronic bilateral low back pain with right-sided sciatica    Patient with a history of the same.  Has recently had a lumbar x-ray that showed degenerative changes.  Patient is already on meloxicam, tizanidine, gabapentin.  Will do a dose of steroids patient will hold meloxicam while on steroids.  Ambulatory referral to physical therapy also.      Relevant Medications   methylPREDNISolone (MEDROL DOSEPAK) 4 MG TBPK tablet   Other Relevant Orders   Ambulatory referral to Physical Therapy     Other   Prediabetes    History of the same pending A1c.      Relevant Orders   Hemoglobin A1c   Lipid panel   Major depressive disorder, recurrent (HCC)    Patient currently on lexapro 20mg . Denies HI/SI/AVH. Amb referral to psychology for therapy. If she does not get good relief consider changing the lexapro       Relevant Orders   Ambulatory referral to Psychology   Insomnia    Trazodone at bedtime prn. Continue       Vitamin D deficiency    History of the same in the setting of osteoporosis pending vitamin D level today.      Relevant Orders   VITAMIN D 25 Hydroxy (Vit-D Deficiency, Fractures)   Preventative health care - Primary    Discussed age-appropriate immunizations and screening exams.  Reviewed patient's personal, surgical, social, family history's.   Patient is up-to-date on all age-appropriate vaccinations.  Patient referral placed for CRC screening.  No longer does cervical cancer screening.  Order placed for mammogram for breast cancer screening.  Patient has  information to call and get mammogram scheduled.  Patient was given information at discharge about preventative healthcare maintenance with anticipatory guidance.      Relevant Orders   CBC   Comprehensive metabolic panel   TSH   Lipid panel   Elevated LDL cholesterol level    Hx of the same. Pending lipid panel      Relevant Orders   Lipid panel   Other Visit Diagnoses     Screening mammogram for breast cancer       Relevant Orders   MM 3D SCREENING MAMMOGRAM BILATERAL BREAST   Screening for colon cancer       Relevant Orders   Ambulatory referral to Gastroenterology       Return in about 6 months (around 10/06/2023) for MDD.    Audria Nine, NP

## 2023-04-05 NOTE — Assessment & Plan Note (Signed)
Discussed age-appropriate immunizations and screening exams.  Reviewed patient's personal, surgical, social, family history's.  Patient is up-to-date on all age-appropriate vaccinations.  Patient referral placed for CRC screening.  No longer does cervical cancer screening.  Order placed for mammogram for breast cancer screening.  Patient has information to call and get mammogram scheduled.  Patient was given information at discharge about preventative healthcare maintenance with anticipatory guidance.

## 2023-04-05 NOTE — Assessment & Plan Note (Signed)
History of the same pending A1c 

## 2023-04-05 NOTE — Assessment & Plan Note (Signed)
Patient with a history of the same.  Has recently had a lumbar x-ray that showed degenerative changes.  Patient is already on meloxicam, tizanidine, gabapentin.  Will do a dose of steroids patient will hold meloxicam while on steroids.  Ambulatory referral to physical therapy also.

## 2023-04-05 NOTE — Assessment & Plan Note (Signed)
Patient currently on lexapro 20mg . Denies HI/SI/AVH. Amb referral to psychology for therapy. If she does not get good relief consider changing the lexapro

## 2023-04-06 LAB — LIPID PANEL
Cholesterol: 206 mg/dL — ABNORMAL HIGH (ref <200)
HDL: 63 mg/dL (ref >=50)
LDL Cholesterol (Calc): 121 mg/dL (calc) — ABNORMAL HIGH
Non-HDL Cholesterol (Calc): 143 mg/dL (calc) — ABNORMAL HIGH (ref <130)
Total CHOL/HDL Ratio: 3.3 (calc) (ref <5.0)
Triglycerides: 113 mg/dL (ref <150)

## 2023-04-06 LAB — COMPREHENSIVE METABOLIC PANEL
AG Ratio: 1.5 (calc) (ref 1.0–2.5)
ALT: 13 U/L (ref 6–29)
AST: 16 U/L (ref 10–35)
Albumin: 3.7 g/dL (ref 3.6–5.1)
Alkaline phosphatase (APISO): 67 U/L (ref 37–153)
BUN: 8 mg/dL (ref 7–25)
CO2: 26 mmol/L (ref 20–32)
Calcium: 8.9 mg/dL (ref 8.6–10.4)
Chloride: 102 mmol/L (ref 98–110)
Creat: 0.8 mg/dL (ref 0.60–1.00)
Globulin: 2.5 g/dL (calc) (ref 1.9–3.7)
Glucose, Bld: 83 mg/dL (ref 65–99)
Potassium: 4.5 mmol/L (ref 3.5–5.3)
Sodium: 136 mmol/L (ref 135–146)
Total Bilirubin: 0.3 mg/dL (ref 0.2–1.2)
Total Protein: 6.2 g/dL (ref 6.1–8.1)

## 2023-04-06 LAB — CBC
HCT: 39.2 % (ref 35.0–45.0)
MCH: 31.4 pg (ref 27.0–33.0)
MPV: 9.3 fL (ref 7.5–12.5)
Platelets: 275 10*3/uL (ref 140–400)
RBC: 4.17 10*6/uL (ref 3.80–5.10)
WBC: 5.2 10*3/uL (ref 3.8–10.8)

## 2023-04-06 LAB — HEMOGLOBIN A1C
Hgb A1c MFr Bld: 5.6 % of total Hgb (ref <5.7)
Mean Plasma Glucose: 114 mg/dL
eAG (mmol/L): 6.3 mmol/L

## 2023-04-06 LAB — VITAMIN D 25 HYDROXY (VIT D DEFICIENCY, FRACTURES): Vit D, 25-Hydroxy: 37 ng/mL (ref 30–100)

## 2023-04-06 LAB — TSH: TSH: 0.84 mIU/L (ref 0.40–4.50)

## 2023-04-10 ENCOUNTER — Telehealth: Payer: Self-pay | Admitting: Nurse Practitioner

## 2023-04-10 DIAGNOSIS — G8929 Other chronic pain: Secondary | ICD-10-CM

## 2023-04-10 NOTE — Telephone Encounter (Signed)
Can we inform the patient of this and see if she would like to go elsewhere
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
## 2023-04-11 NOTE — Telephone Encounter (Signed)
Patient returned call and said that she would be fine with being referred out somewhere else ,she said that she live between [place] and [place] ,so a location at either place would be fine.

## 2023-04-11 NOTE — Telephone Encounter (Signed)
Left voicemail for patient to call the office back.   

## 2023-04-11 NOTE — Telephone Encounter (Signed)
New referral placed.

## 2023-04-12 DIAGNOSIS — H52223 Regular astigmatism, bilateral: Secondary | ICD-10-CM | POA: Diagnosis not present

## 2023-04-12 DIAGNOSIS — H18413 Arcus senilis, bilateral: Secondary | ICD-10-CM | POA: Diagnosis not present

## 2023-04-12 DIAGNOSIS — H2513 Age-related nuclear cataract, bilateral: Secondary | ICD-10-CM | POA: Diagnosis not present

## 2023-04-16 ENCOUNTER — Encounter: Payer: Self-pay | Admitting: *Deleted

## 2023-04-23 DIAGNOSIS — Z0100 Encounter for examination of eyes and vision without abnormal findings: Secondary | ICD-10-CM | POA: Diagnosis not present

## 2023-04-24 ENCOUNTER — Ambulatory Visit: Payer: Medicare HMO | Attending: Nurse Practitioner

## 2023-04-24 DIAGNOSIS — G8929 Other chronic pain: Secondary | ICD-10-CM | POA: Diagnosis not present

## 2023-04-24 DIAGNOSIS — M5431 Sciatica, right side: Secondary | ICD-10-CM | POA: Diagnosis not present

## 2023-04-24 DIAGNOSIS — M5441 Lumbago with sciatica, right side: Secondary | ICD-10-CM | POA: Insufficient documentation

## 2023-04-24 DIAGNOSIS — M6281 Muscle weakness (generalized): Secondary | ICD-10-CM | POA: Diagnosis not present
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
## 2023-04-26 ENCOUNTER — Ambulatory Visit: Payer: Medicare HMO

## 2023-04-26 ENCOUNTER — Telehealth: Payer: Self-pay

## 2023-05-02 ENCOUNTER — Ambulatory Visit
Admission: RE | Admit: 2023-05-02 | Discharge: 2023-05-02 | Disposition: A | Discharge status: Home / Self Care | Payer: Medicare HMO | Source: Ambulatory Visit | Attending: Nurse Practitioner | Admitting: Nurse Practitioner

## 2023-05-02 ENCOUNTER — Other Ambulatory Visit: Payer: Self-pay | Admitting: Family Medicine

## 2023-05-02 DIAGNOSIS — Z1231 Encounter for screening mammogram for malignant neoplasm of breast: Secondary | ICD-10-CM | POA: Diagnosis not present

## 2023-05-02 NOTE — Telephone Encounter (Signed)
Last office visit 04/05/2023 for CPE with [facility].  Last refilled 03/01/2023 for #30 with no refills by Dr. Ermalene Searing.  Next Appt:  No future appointments at [facility]

## 2023-05-03 ENCOUNTER — Ambulatory Visit: Payer: Medicare HMO

## 2023-05-08 ENCOUNTER — Ambulatory Visit: Payer: Medicare HMO

## 2023-05-13 ENCOUNTER — Telehealth: Payer: Self-pay | Admitting: Nurse Practitioner

## 2023-05-13 DIAGNOSIS — G8929 Other chronic pain: Secondary | ICD-10-CM

## 2023-05-13 NOTE — Telephone Encounter (Signed)
Patient was given orders in July did not like the one time she went. She scheduled next appointment with [facility] needs  orders resent to them. Ok to send?

## 2023-05-13 NOTE — Telephone Encounter (Signed)
Alana from [facility] called over and stated that they are needing an order for the patient. She is scheduled to see them tomorrow (8/27) at 12. It can be faxed over to [phone]. Thank you!

## 2023-05-14 NOTE — Telephone Encounter (Signed)
Ok to send

## 2023-05-16 NOTE — Addendum Note (Signed)
Addended by: Eden Emms on: 05/16/2023 04:51 PM   Modules accepted: Orders

## 2023-05-16 NOTE — Telephone Encounter (Signed)
Referral placed.

## 2023-05-22 DIAGNOSIS — M5431 Sciatica, right side: Secondary | ICD-10-CM | POA: Diagnosis not present

## 2023-05-22 DIAGNOSIS — M6281 Muscle weakness (generalized): Secondary | ICD-10-CM | POA: Diagnosis not present

## 2023-05-24 DIAGNOSIS — M5431 Sciatica, right side: Secondary | ICD-10-CM | POA: Diagnosis not present

## 2023-05-24 DIAGNOSIS — M6281 Muscle weakness (generalized): Secondary | ICD-10-CM | POA: Diagnosis not present

## 2023-05-31 ENCOUNTER — Other Ambulatory Visit: Payer: Self-pay | Admitting: Family Medicine

## 2023-05-31 NOTE — Telephone Encounter (Signed)
Last office visit 04/05/2023 for CPE.  Last refilled 05/02/2023 for #30 with no refills by Dr. Ermalene Searing.  Will send to PCP since Dr. Ermalene Searing is out of the office.  Patient has TOC with Clent Ridges at [facility] on 07/05/23.

## 2023-06-05 ENCOUNTER — Telehealth: Payer: Self-pay

## 2023-06-06 DIAGNOSIS — E785 Hyperlipidemia, unspecified: Secondary | ICD-10-CM | POA: Diagnosis not present

## 2023-06-06 DIAGNOSIS — M1990 Unspecified osteoarthritis, unspecified site: Secondary | ICD-10-CM | POA: Diagnosis not present

## 2023-06-06 DIAGNOSIS — Z008 Encounter for other general examination: Secondary | ICD-10-CM | POA: Diagnosis not present

## 2023-06-06 DIAGNOSIS — M069 Rheumatoid arthritis, unspecified: Secondary | ICD-10-CM | POA: Diagnosis not present

## 2023-06-06 DIAGNOSIS — M62838 Other muscle spasm: Secondary | ICD-10-CM | POA: Diagnosis not present

## 2023-06-06 DIAGNOSIS — G4700 Insomnia, unspecified: Secondary | ICD-10-CM | POA: Diagnosis not present

## 2023-06-06 DIAGNOSIS — M5440 Lumbago with sciatica, unspecified side: Secondary | ICD-10-CM | POA: Diagnosis not present

## 2023-06-06 DIAGNOSIS — Z791 Long term (current) use of non-steroidal anti-inflammatories (NSAID): Secondary | ICD-10-CM | POA: Diagnosis not present

## 2023-06-06 DIAGNOSIS — F411 Generalized anxiety disorder: Secondary | ICD-10-CM | POA: Diagnosis not present

## 2023-06-06 DIAGNOSIS — Z8249 Family history of ischemic heart disease and other diseases of the circulatory system: Secondary | ICD-10-CM | POA: Diagnosis not present

## 2023-06-06 DIAGNOSIS — Z823 Family history of stroke: Secondary | ICD-10-CM | POA: Diagnosis not present

## 2023-06-06 DIAGNOSIS — I739 Peripheral vascular disease, unspecified: Secondary | ICD-10-CM | POA: Diagnosis not present

## 2023-06-06 DIAGNOSIS — Z87891 Personal history of nicotine dependence: Secondary | ICD-10-CM | POA: Diagnosis not present

## 2023-06-06 NOTE — Telephone Encounter (Signed)
Error

## 2023-06-10 ENCOUNTER — Ambulatory Visit: Payer: Medicare HMO | Admitting: Family Medicine

## 2023-06-19 ENCOUNTER — Encounter: Payer: Medicare HMO | Admitting: Family Medicine

## 2023-06-21 ENCOUNTER — Other Ambulatory Visit: Payer: Self-pay | Admitting: Nurse Practitioner

## 2023-06-21 DIAGNOSIS — Z1211 Encounter for screening for malignant neoplasm of colon: Secondary | ICD-10-CM

## 2023-06-21 DIAGNOSIS — Z1212 Encounter for screening for malignant neoplasm of rectum: Secondary | ICD-10-CM

## 2023-06-26 ENCOUNTER — Telehealth: Payer: Self-pay | Admitting: Nurse Practitioner

## 2023-06-26 NOTE — Telephone Encounter (Signed)
Patient called in and stated that she tried to get schedule with D[facility] and they informed her that she isn't accepting new patients right now. She was wanting to know if her referral could be sent somewhere. Thank you!

## 2023-06-26 NOTE — Telephone Encounter (Signed)
Can she be referred to another [place] clinician

## 2023-07-05 ENCOUNTER — Encounter: Payer: Medicare HMO | Admitting: Family Medicine

## 2023-08-06 ENCOUNTER — Ambulatory Visit: Payer: Medicare HMO | Admitting: Psychology

## 2023-08-13 ENCOUNTER — Ambulatory Visit (INDEPENDENT_AMBULATORY_CARE_PROVIDER_SITE_OTHER): Payer: Medicare HMO

## 2023-08-13 VITALS — Ht 63.0 in | Wt 115.0 lb

## 2023-08-13 DIAGNOSIS — Z0000 Encounter for general adult medical examination without abnormal findings: Secondary | ICD-10-CM

## 2023-08-13 NOTE — Patient Instructions (Signed)
Ms. Swoveland , Thank you for taking time to come for your Medicare Wellness Visit. I appreciate your ongoing commitment to your health goals. Please review the following plan we discussed and let me know if I can assist you in the future.   Referrals/Orders/Follow-Ups/Clinician Recommendations: none  This is a list of the screening recommended for you and due dates:  Health Maintenance  Topic Date Due   DTaP/Tdap/Td vaccine (1 - Tdap) Never done   Cologuard (Stool DNA test)  Never done   Zoster (Shingles) Vaccine (1 of 2) Never done   Flu Shot  04/18/2023   COVID-19 Vaccine (3 - 2023-24 season) 05/19/2023   Medicare Annual Wellness Visit  08/12/2024   Mammogram  05/01/2025   Pneumonia Vaccine  Completed   DEXA scan (bone density measurement)  Completed   Hepatitis C Screening  Completed   HPV Vaccine  Aged Out    Advanced directives: (Provided) Advance directive discussed with you today. I have provided a copy for you to complete at home and have notarized. Once this is complete, please bring a copy in to our office so we can scan it into your chart.  Pt requested to mail to her home  Next Medicare Annual Wellness Visit scheduled for next year: Yes 08/14/24 @ 10:50am telephone

## 2023-08-13 NOTE — Progress Notes (Signed)
Subjective:   Mercedes Mcintosh is a 72 y.o. female who presents for Medicare Annual (Subsequent) preventive examination.  Visit Complete: Virtual I connected with  Izzie Charette Swiatek on 08/13/23 by a audio enabled telemedicine application and verified that I am speaking with the correct person using two identifiers.  Patient Location: Home  Provider Location: Home Office  I discussed the limitations of evaluation and management by telemedicine. The patient expressed understanding and agreed to proceed.  Vital Signs: Because this visit was a virtual/telehealth visit, some criteria may be missing or patient reported. Any vitals not documented were not able to be obtained and vitals that have been documented are patient reported.  Patient Medicare AWV questionnaire was completed by the patient on (not done); I have confirmed that all information answered by patient is correct and no changes since this date. Cardiac Risk Factors include: advanced age (>35men, >56 women)    Objective:    Today's Vitals   08/13/23 1024 08/13/23 1025  Weight: 115 lb (52.2 kg)   Height: 5\' 3"  (1.6 m)   PainSc:  7    Body mass index is 20.37 kg/m.     08/13/2023   10:46 AM 04/24/2023    1:50 PM 08/25/2021    1:59 PM 06/02/2021   10:40 AM 06/27/2019    6:49 PM 10/13/2018    7:29 PM 06/04/2015    6:29 PM  Advanced Directives  Does Patient Have a Medical Advance Directive? No No No No No No No  Would patient like information on creating a medical advance directive?  Yes (Inpatient - patient defers creating a medical advance directive at this time - Information given) No - Patient declined No - Patient declined       Current Medications (verified) Outpatient Encounter Medications as of 08/13/2023  Medication Sig   escitalopram (LEXAPRO) 20 MG tablet Take 1 tablet (20 mg total) by mouth daily.   gabapentin (NEURONTIN) 300 MG capsule TAKE 1 CAPSULE BY MOUTH EVERY DAY AS NEEDED   meloxicam (MOBIC) 7.5 MG  tablet TAKE 1 TABLET BY MOUTH EVERY DAY AS NEEDED FOR PAIN   tiZANidine (ZANAFLEX) 2 MG tablet Take 1 tablet (2 mg total) by mouth at bedtime as needed for muscle spasms.   traMADol (ULTRAM) 50 MG tablet Take 1 tablet by mouth every 6 (six) hours as needed.   traZODone (DESYREL) 50 MG tablet TAKE 1 TABLET BY MOUTH EVERY DAY AT BEDTIME AS NEEDED FOR SLEEP   methylPREDNISolone (MEDROL DOSEPAK) 4 MG TBPK tablet As directed (Patient not taking: Reported on 08/13/2023)   No facility-administered encounter medications on file as of 08/13/2023.    Allergies (verified) Metronidazole   History: Past Medical History:  Diagnosis Date   Depression    Osteoporosis    Past Surgical History:  Procedure Laterality Date   ABDOMINAL HYSTERECTOMY     Family History  Problem Relation Age of Onset   Breast cancer Paternal Aunt    Bursitis Mother    Arthritis Mother    Skin cancer Father    Lung cancer Father        nonsmoker   Lung cancer Paternal Grandfather        nonsmoker   Social History   Socioeconomic History   Marital status: Widowed    Spouse name: Not on file   Number of children: 2   Years of education: some college   Highest education level: Not on file  Occupational History   Not on  file  Tobacco Use   Smoking status: Former    Current packs/day: 0.00    Average packs/day: 0.1 packs/day for 8.0 years (0.8 ttl pk-yrs)    Types: Cigarettes    Start date: 2004    Quit date: 2012    Years since quitting: 12.9   Smokeless tobacco: Never  Vaping Use   Vaping status: Never Used  Substance and Sexual Activity   Alcohol use: Yes    Comment: twice a month, one drink   Drug use: No   Sexual activity: Not Currently  Other Topics Concern   Not on file  Social History Narrative   03/29/20   From: the area   Living: with her 67 year old grandson in a townhouse   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, children's ([employer], [place]) and [employer]  ([place]), lives with [employer] (grandson that lives with her) and has 5 other grandchildren      Enjoys: bowling, being with family, singing, goes to church      Exercise: doing PT, [employer] member   Diet: not great, drinks coke more than she should, skips breakfast      Safety   Seat belts: Yes    Guns: No   Safe in relationships: Yes    Social Determinants of Health   Financial Resource Strain: Low Risk  (08/13/2023)   Overall Financial Resource Strain (CARDIA)    Difficulty of Paying Living Expenses: Not hard at all  Food Insecurity: No Food Insecurity (08/13/2023)   Hunger Vital Sign    Worried About Running Out of Food in the Last Year: Never true    Ran Out of Food in the Last Year: Never true  Transportation Needs: No Transportation Needs (08/13/2023)   PRAPARE - Administrator, Civil Service (Medical): No    Lack of Transportation (Non-Medical): No  Physical Activity: Sufficiently Active (08/13/2023)   Exercise Vital Sign    Days of Exercise per Week: 3 days    Minutes of Exercise per Session: 60 min  Stress: Stress Concern Present (08/13/2023)   [employer] of Occupational Health - Occupational Stress Questionnaire    Feeling of Stress : To some extent  Social Connections: Moderately Isolated (08/13/2023)   Social Connection and Isolation Panel [NHANES]    Frequency of Communication with Friends and Family: More than three times a week    Frequency of Social Gatherings with Friends and Family: Twice a week    Attends Religious Services: More than 4 times per year    Active Member of [employer] or Organizations: No    Attends Banker Meetings: Never    Marital Status: Widowed    Tobacco Counseling Counseling given: Not Answered   Clinical Intake:  Pre-visit preparation completed: Yes  Pain : 0-10 Pain Score: 7  Pain Type: Chronic pain Pain Location: Back (rt leg pain and other places) Pain Orientation: Lower Pain Descriptors /  Indicators: Aching, Sharp Pain Onset: More than a month ago Pain Frequency: Intermittent Pain Relieving Factors: medications, elevate leg  Pain Relieving Factors: medications, elevate leg  BMI - recorded: 20.37 Nutritional Status: BMI of 19-24  Normal Nutritional Risks: None Diabetes: No  How often do you need to have someone help you when you read instructions, pamphlets, or other written materials from your doctor or pharmacy?: 1 - Never  Interpreter Needed?: No  Comments: lives alone mostly:grandson lives there sometimes Information  entered by :: B.Elverda Wendel,LPN   Activities of Daily Living    08/13/2023   10:47 AM  In your present state of health, do you have any difficulty performing the following activities:  Hearing? 0  Vision? 0  Difficulty concentrating or making decisions? 0  Walking or climbing stairs? 0  Dressing or bathing? 0  Doing errands, shopping? 0  Preparing Food and eating ? N  Using the Toilet? N  In the past six months, have you accidently leaked urine? N  Do you have problems with loss of bowel control? N  Managing your Medications? N  Managing your Finances? N  Housekeeping or managing your Housekeeping? N    Patient Care Team: Eden Emms, NP as PCP - General (Nurse Practitioner) Carrie Mew, OD (Optometry)  Indicate any recent Medical Services you may have received from other than [facility] providers in the past year (date may be approximate).     Assessment:   This is a routine wellness examination for Perry.  Hearing/Vision screen Hearing Screening - Comments:: Pt says her hearing is ok Vision Screening - Comments:: Pt says her vision is ok with glasses [facility]   Goals Addressed             This Visit's Progress    Patient Stated   Not on track    Eat healthier       Depression Screen    08/13/2023   10:40 AM 04/05/2023   11:49 AM 02/26/2023   12:25 PM 10/02/2022   11:37 AM 06/05/2022   11:46 AM  09/28/2021   11:19 AM 05/09/2021   10:27 AM  PHQ 2/9 Scores  PHQ - 2 Score 4 6 3 3 5 3 4   PHQ- 9 Score 11 13 8 8 16 13 11     Fall Risk    08/13/2023   10:32 AM 04/05/2023   11:49 AM 02/26/2023   12:24 PM 06/05/2022   11:14 AM 05/09/2021    9:36 AM  Fall Risk   Falls in the past year? 1 0 0 0 0  Number falls in past yr: 0 0 0 0 0  Injury with Fall? 0 0 0    Risk for fall due to : No Fall Risks No Fall Risks No Fall Risks    Follow up Education provided;Falls prevention discussed Falls evaluation completed Falls evaluation completed      MEDICARE RISK AT HOME: Medicare Risk at Home Any stairs in or around the home?: Yes If so, are there any without handrails?: Yes Home free of loose throw rugs in walkways, pet beds, electrical cords, etc?: Yes Adequate lighting in your home to reduce risk of falls?: Yes Life alert?: No Use of a cane, walker or w/c?: No Grab bars in the bathroom?: No Shower chair or bench in shower?: No Elevated toilet seat or a handicapped toilet?: No  TIMED UP AND GO:  Was the test performed?  No    Cognitive Function:        08/13/2023   10:57 AM  6CIT Screen  What Year? 0 points  What month? 0 points  What time? 0 points  Count back from 20 0 points  Months in reverse 0 points  Repeat phrase 0 points  Total Score 0 points    Immunizations Immunization History  Administered Date(s) Administered   Fluad Quad(high Dose 65+) 06/05/2022   Janssen (J&J) SARS-COV-2 Vaccination 12/28/2019   PFIZER(Purple Top)SARS-COV-2 Vaccination 11/03/2020   PPD Test 10/30/2021  Pneumococcal Conjugate-13 05/21/2018   Pneumococcal Polysaccharide-23 06/05/2022    TDAP status: Up to date  Flu Vaccine status: Due, Education has been provided regarding the importance of this vaccine. Advised may receive this vaccine at local pharmacy or Health Dept. Aware to provide a copy of the vaccination record if obtained from local pharmacy or Health Dept. Verbalized  acceptance and understanding.  Pneumococcal vaccine status: Up to date  Covid-19 vaccine status: Completed vaccines  Qualifies for Shingles Vaccine? Yes   Zostavax completed No   Shingrix Completed?: No.    Education has been provided regarding the importance of this vaccine. Patient has been advised to call insurance company to determine out of pocket expense if they have not yet received this vaccine. Advised may also receive vaccine at local pharmacy or Health Dept. Verbalized acceptance and understanding.  Screening Tests Health Maintenance  Topic Date Due   COVID-19 Vaccine (3 - 2023-24 season) 08/29/2023 (Originally 05/19/2023)   Zoster Vaccines- Shingrix (1 of 2) 11/13/2023 (Originally 01/12/2003)   INFLUENZA VACCINE  12/16/2023 (Originally 04/18/2023)   DTaP/Tdap/Td (1 - Tdap) 08/12/2024 (Originally 01/12/1972)   Fecal DNA (Cologuard)  08/12/2024 (Originally 01/11/1998)   Medicare Annual Wellness (AWV)  08/12/2024   MAMMOGRAM  05/01/2025   Pneumonia Vaccine 91+ Years old  Completed   DEXA SCAN  Completed   Hepatitis C Screening  Completed   HPV VACCINES  Aged Out    Health Maintenance  There are no preventive care reminders to display for this patient.   Colorectal cancer screening: Type of screening: Colonoscopy. Completed 2018. Repeat every 10 years  Mammogram status: Completed 05/02/23. Repeat every year  Bone Density status: Completed 01/25/2022. Results reflect: Bone density results: NORMAL. Repeat every 5 years.  Lung Cancer Screening: (Low Dose CT Chest recommended if Age 70-80 years, 20 pack-year currently smoking OR have quit w/in 15years.) does not qualify.   Lung Cancer Screening Referral: no  Additional Screening:  Hepatitis C Screening: does not qualify; Completed 05/15/2012  Vision Screening: Recommended annual ophthalmology exams for early detection of glaucoma and other disorders of the eye. Is the patient up to date with their annual eye exam?  Yes  Who  is the provider or what is the name of the office in which the patient attends annual eye exams? [facility]/Dr Lew Dawes If pt is not established with a provider, would they like to be referred to a provider to establish care? No .   Dental Screening: Recommended annual dental exams for proper oral hygiene  Diabetic Foot Exam: n/a  Community Resource Referral / Chronic Care Management: CRR required this visit?  No   CCM required this visit?  No    Plan:     I have personally reviewed and noted the following in the patient's chart:   Medical and social history Use of alcohol, tobacco or illicit drugs  Current medications and supplements including opioid prescriptions. Patient is not currently taking opioid prescriptions. Functional ability and status Nutritional status Physical activity Advanced directives List of other physicians Hospitalizations, surgeries, and ER visits in previous 12 months Vitals Screenings to include cognitive, depression, and falls Referrals and appointments  In addition, I have reviewed and discussed with patient certain preventive protocols, quality metrics, and best practice recommendations. A written personalized care plan for preventive services as well as general preventive health recommendations were provided to patient.    Sue Lush, LPN   [id number]   After Visit Summary: (MyChart) Due to this being a  telephonic visit, the after visit summary with patients personalized plan was offered to patient via MyChart   Nurse Notes: The patient states she is doing well and has no concerns or questions at this time.

## 2023-08-27 ENCOUNTER — Encounter: Payer: Medicare HMO | Admitting: Family Medicine

## 2023-10-04 ENCOUNTER — Encounter: Payer: Medicare HMO | Admitting: Family Medicine

## 2023-10-30 ENCOUNTER — Encounter: Payer: Self-pay | Admitting: Primary Care

## 2023-10-30 ENCOUNTER — Ambulatory Visit: Payer: Medicare HMO | Admitting: Primary Care

## 2023-10-30 ENCOUNTER — Ambulatory Visit (INDEPENDENT_AMBULATORY_CARE_PROVIDER_SITE_OTHER)
Admission: RE | Admit: 2023-10-30 | Discharge: 2023-10-30 | Disposition: A | Discharge status: Home / Self Care | Payer: Medicare HMO | Source: Ambulatory Visit | Attending: Primary Care | Admitting: Primary Care

## 2023-10-30 VITALS — BP 104/62 | HR 59 | Temp 97.5°F | Ht 63.0 in | Wt 118.0 lb

## 2023-10-30 DIAGNOSIS — M5442 Lumbago with sciatica, left side: Secondary | ICD-10-CM | POA: Diagnosis not present

## 2023-10-30 DIAGNOSIS — G8929 Other chronic pain: Secondary | ICD-10-CM | POA: Diagnosis not present

## 2023-10-30 DIAGNOSIS — M5441 Lumbago with sciatica, right side: Secondary | ICD-10-CM

## 2023-10-30 MED ORDER — TIZANIDINE HCL 2 MG PO TABS: 2.0000 mg | ORAL_TABLET | Freq: Three times a day (TID) | ORAL | 0 refills | Status: DC | PRN

## 2023-10-30 MED ORDER — MELOXICAM 7.5 MG PO TABS: 7.5000 mg | ORAL_TABLET | Freq: Every day | ORAL | 0 refills | Status: DC | PRN

## 2023-10-30 MED ORDER — METHYLPREDNISOLONE 4 MG PO TBPK: ORAL_TABLET | ORAL | 0 refills | Status: DC

## 2023-10-30 NOTE — Patient Instructions (Signed)
You may take the tizanidine muscle relaxer every 8 hours as needed for muscle spasms.  Be careful as this may cause drowsiness.  You may take your meloxicam pain medication once daily as needed for pain.  Start the methylprednisolone steroid Dosepak to help reduce nerve irritation.  Follow the package instructions.  Complete xray(s) prior to leaving today. I will notify you of your results once received.  It was a pleasure meeting you!

## 2023-10-30 NOTE — Assessment & Plan Note (Signed)
No alarm signs on exam.  Will obtain lumbar plain films.  Ultimately, she may need lumbar MRI and/or neurosurgery referral.  Refill provided for tizanidine 2 mg to use every 8 hours.  Drowsiness precautions provided. Refill provided for meloxicam 7.5 mg daily as needed.  Prescription for Medrol dose pack provided to use for sciatic nerve involvement.  Continue gabapentin 300 mg at bedtime prescribed.  Caution was provided to patient if taking tizanidine and gabapentin at bedtime given risks for drowsiness.  Await results.

## 2023-10-30 NOTE — Progress Notes (Signed)
Subjective:    Patient ID: Mercedes Mcintosh, female    DOB: 1951-04-20, 73 y.o.   MRN: [id number]  Back Pain Associated symptoms include numbness. Pertinent negatives include no chest pain or weakness.    Mercedes Mcintosh is a very pleasant 73 y.o. female patient of Matt, NP with a history of chronic bilateral low back pain with right-sided sciatica, T3 vertebral compression fracture, osteoporosis of forearm, chronic right shoulder pain who presents today to discuss back pain.  Chronic history of low back pain. Over the last month her pain has increased, is constant. Her pain is located across the bilateral back with radiation to her bilateral lower extremities , right worse than left which radiates down to her ankle. Also with numbness to the right lower extremity intermittently.   Her pain is worse with prolonged sitting and laying in bed. She has to sleep on her left side.    She's been taking gabapentin 300 mg HS which has not helped much. She ran out of her Meloxicam 1 week ago, didn't help much with pain. She ran out of her tizanidine months ago. The gabapentin does make her drowsy.  She has participated in physical therapy, unfortunately had to stop early due to finances.  She has been doing home physical therapy exercises intermittently for the last month.  She has not undergone a MRI of the lumbar spine within the last 5 years.  She denies loss of bowel/bladder control.  Review of Systems  Respiratory:  Negative for shortness of breath.   Cardiovascular:  Negative for chest pain.  Musculoskeletal:  Positive for back pain.  Neurological:  Positive for numbness. Negative for weakness.         Past Medical History:  Diagnosis Date   Depression    Osteoporosis     Social History   Socioeconomic History   Marital status: Widowed    Spouse name: Not on file   Number of children: 2   Years of education: some college   Highest education level: Not on file  Occupational  History   Not on file  Tobacco Use   Smoking status: Former    Current packs/day: 0.00    Average packs/day: 0.1 packs/day for 8.0 years (0.8 ttl pk-yrs)    Types: Cigarettes    Start date: 2004    Quit date: 2012    Years since quitting: 13.1   Smokeless tobacco: Never  Vaping Use   Vaping status: Never Used  Substance and Sexual Activity   Alcohol use: Yes    Comment: twice a month, one drink   Drug use: No   Sexual activity: Not Currently  Other Topics Concern   Not on file  Social History Narrative   03/29/20   From: the area   Living: with her 106 year old grandson in a townhouse   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, children's ([employer], [place] [address]) and [employer] ([place]), lives with [employer] (grandson that lives with her) and has 5 other grandchildren      Enjoys: bowling, being with family, singing, goes to church      Exercise: doing PT, [employer] member   Diet: not great, drinks coke more than she should, skips breakfast      Safety   Seat belts: Yes    Guns: No   Safe in relationships: Yes    Social Drivers of Corporate investment banker Strain:  Low Risk  (08/13/2023)   Overall Financial Resource Strain (CARDIA)    Difficulty of Paying Living Expenses: Not hard at all  Food Insecurity: No Food Insecurity (08/13/2023)   Hunger Vital Sign    Worried About Running Out of Food in the Last Year: Never true    Ran Out of Food in the Last Year: Never true  Transportation Needs: No Transportation Needs (08/13/2023)   PRAPARE - Administrator, Civil Service (Medical): No    Lack of Transportation (Non-Medical): No  Physical Activity: Sufficiently Active (08/13/2023)   Exercise Vital Sign    Days of Exercise per Week: 3 days    Minutes of Exercise per Session: 60 min  Stress: Stress Concern Present (08/13/2023)   [employer] of Occupational Health - Occupational Stress Questionnaire    Feeling of Stress : To some extent   Social Connections: Moderately Isolated (08/13/2023)   Social Connection and Isolation Panel [NHANES]    Frequency of Communication with Friends and Family: More than three times a week    Frequency of Social Gatherings with Friends and Family: Twice a week    Attends Religious Services: More than 4 times per year    Active Member of [employer] or Organizations: No    Attends Banker Meetings: Never    Marital Status: Widowed  Intimate Partner Violence: Not At Risk (08/13/2023)   Humiliation, Afraid, Rape, and Kick questionnaire    Fear of Current or Ex-Partner: No    Emotionally Abused: No    Physically Abused: No    Sexually Abused: No    Past Surgical History:  Procedure Laterality Date   ABDOMINAL HYSTERECTOMY      Family History  Problem Relation Age of Onset   Breast cancer Paternal Aunt    Bursitis Mother    Arthritis Mother    Skin cancer Father    Lung cancer Father        nonsmoker   Lung cancer Paternal Grandfather        nonsmoker    Allergies  Allergen Reactions   Metronidazole Rash    Current Outpatient Medications on File Prior to Visit  Medication Sig Dispense Refill   escitalopram (LEXAPRO) 20 MG tablet Take 1 tablet (20 mg total) by mouth daily. 90 tablet 0   gabapentin (NEURONTIN) 300 MG capsule TAKE 1 CAPSULE BY MOUTH EVERY DAY AS NEEDED 30 capsule 0   traMADol (ULTRAM) 50 MG tablet Take 1 tablet by mouth every 6 (six) hours as needed.     traZODone (DESYREL) 50 MG tablet TAKE 1 TABLET BY MOUTH EVERY DAY AT BEDTIME AS NEEDED FOR SLEEP 90 tablet 0   No current facility-administered medications on file prior to visit.    BP 104/62   Pulse (!) 59   Temp (!) 97.5 F (36.4 C) (Temporal)   Ht 5\' 3"  (1.6 m)   Wt 118 lb (53.5 kg)   SpO2 97%   BMI 20.90 kg/m  Objective:   Physical Exam Cardiovascular:     Rate and Rhythm: Normal rate and regular rhythm.  Pulmonary:     Effort: Pulmonary effort is normal.  Musculoskeletal:      Cervical back: Neck supple.     Lumbar back: Decreased range of motion. Negative right straight leg raise test and negative left straight leg raise test.       Legs:  Skin:    General: Skin is warm and dry.  Neurological:  Mental Status: She is alert and oriented to person, place, and time.  Psychiatric:        Mood and Affect: Mood normal.           Assessment & Plan:  Chronic bilateral low back pain with bilateral sciatica Assessment & Plan: No alarm signs on exam.  Will obtain lumbar plain films.  Ultimately, she may need lumbar MRI and/or neurosurgery referral.  Refill provided for tizanidine 2 mg to use every 8 hours.  Drowsiness precautions provided. Refill provided for meloxicam 7.5 mg daily as needed.  Prescription for Medrol dose pack provided to use for sciatic nerve involvement.  Continue gabapentin 300 mg at bedtime prescribed.  Caution was provided to patient if taking tizanidine and gabapentin at bedtime given risks for drowsiness.  Await results.  Orders: -     Meloxicam; Take 1 tablet (7.5 mg total) by mouth daily as needed for pain.  Dispense: 30 tablet; Refill: 0 -     methylPREDNISolone; As directed  Dispense: 1 each; Refill: 0 -     tiZANidine HCl; Take 1 tablet (2 mg total) by mouth every 8 (eight) hours as needed for muscle spasms.  Dispense: 30 tablet; Refill: 0 -     DG Lumbar Spine Complete        Doreene Nest, NP

## 2023-11-07 ENCOUNTER — Telehealth: Payer: Self-pay | Admitting: *Deleted

## 2023-11-07 NOTE — Telephone Encounter (Signed)
 Copied from CRM [id number]. Topic: Clinical - Lab/Test Results >> Nov 07, 2023  4:23 PM Mercedes Mcintosh wrote: Reason for CRM: patient calling for results of DG Lumbar Spine Complete (Order [id number]), advised patient it was still being reviewed by radiology and once it was released the provider would review it, and then she would get a call back with those results.

## 2023-11-14 DIAGNOSIS — S93602A Unspecified sprain of left foot, initial encounter: Secondary | ICD-10-CM | POA: Diagnosis not present

## 2023-11-14 DIAGNOSIS — M25572 Pain in left ankle and joints of left foot: Secondary | ICD-10-CM | POA: Diagnosis not present

## 2023-11-15 ENCOUNTER — Other Ambulatory Visit: Payer: Self-pay | Admitting: Primary Care

## 2023-11-15 DIAGNOSIS — G8929 Other chronic pain: Secondary | ICD-10-CM

## 2023-11-22 ENCOUNTER — Encounter: Payer: Medicare HMO | Admitting: Family Medicine

## 2023-12-12 ENCOUNTER — Other Ambulatory Visit: Payer: Self-pay | Admitting: Nurse Practitioner

## 2023-12-12 DIAGNOSIS — G8929 Other chronic pain: Secondary | ICD-10-CM

## 2024-01-03 ENCOUNTER — Other Ambulatory Visit: Payer: Self-pay | Admitting: Primary Care

## 2024-01-03 DIAGNOSIS — G8929 Other chronic pain: Secondary | ICD-10-CM

## 2024-01-22 ENCOUNTER — Telehealth: Payer: Self-pay | Admitting: Nurse Practitioner

## 2024-01-22 NOTE — Telephone Encounter (Signed)
 Called patient let her know has been ordered and approved. I have provided her with the number below to call and set up MRI.  [phone]

## 2024-01-22 NOTE — Telephone Encounter (Signed)
 Copied from CRM [id number]. Topic: Appointments - Scheduling Inquiry for Clinic >> Jan 22, 2024  9:08 AM Mercedes Mcintosh wrote: Reason for CRM: Patient stated she would like an MRI scheduled due to leg issues still. Please call [phone]

## 2024-01-25 ENCOUNTER — Ambulatory Visit
Admission: RE | Admit: 2024-01-25 | Discharge: 2024-01-25 | Disposition: A | Discharge status: Home / Self Care | Source: Ambulatory Visit | Attending: Nurse Practitioner | Admitting: Nurse Practitioner

## 2024-01-25 DIAGNOSIS — M5137 Other intervertebral disc degeneration, lumbosacral region with discogenic back pain only: Secondary | ICD-10-CM | POA: Diagnosis not present

## 2024-01-25 DIAGNOSIS — M5441 Lumbago with sciatica, right side: Secondary | ICD-10-CM | POA: Diagnosis not present

## 2024-01-25 DIAGNOSIS — M47816 Spondylosis without myelopathy or radiculopathy, lumbar region: Secondary | ICD-10-CM | POA: Diagnosis not present

## 2024-01-25 DIAGNOSIS — M5442 Lumbago with sciatica, left side: Secondary | ICD-10-CM | POA: Diagnosis not present

## 2024-01-25 DIAGNOSIS — G8929 Other chronic pain: Secondary | ICD-10-CM | POA: Diagnosis not present

## 2024-01-25 DIAGNOSIS — M48061 Spinal stenosis, lumbar region without neurogenic claudication: Secondary | ICD-10-CM | POA: Diagnosis not present

## 2024-01-25 DIAGNOSIS — M47817 Spondylosis without myelopathy or radiculopathy, lumbosacral region: Secondary | ICD-10-CM | POA: Diagnosis not present

## 2024-02-24 ENCOUNTER — Ambulatory Visit: Payer: Self-pay | Admitting: Nurse Practitioner

## 2024-02-24 DIAGNOSIS — M48061 Spinal stenosis, lumbar region without neurogenic claudication: Secondary | ICD-10-CM

## 2024-02-25 ENCOUNTER — Telehealth: Payer: Self-pay

## 2024-02-25 NOTE — Telephone Encounter (Signed)
 Copied from CRM [id number]. Topic: General - Call Back - No Documentation >> Feb 25, 2024  9:42 AM Marlan Silva wrote: Reason for CRM: Patient called in stating that she had a missed call from the clinic. After checking her chart CMA asked for her to contact the clinic back. Patient can be reached at [phone].

## 2024-02-25 NOTE — Telephone Encounter (Signed)
-----   Message from [facility] T sent at 02/25/2024  1:10 PM EDT ----- Called patient reviewed all information and repeated back to me. Will call if any questions.    Pt agrees to see a Neurosurgeon in [place].

## 2024-02-25 NOTE — Telephone Encounter (Signed)
 Returned pt call and reviewed lab results with patient.

## 2024-02-27 ENCOUNTER — Telehealth: Payer: Self-pay | Admitting: Nurse Practitioner

## 2024-02-27 NOTE — Telephone Encounter (Signed)
 Copied from CRM [id number]. Topic: Referral - Question >> Feb 26, 2024  4:23 PM Melissa C wrote: Reason for CRM: patient called with questions about her back/leg and I let her know that her back issue is what can be affecting her leg pain. She also would like either to make sure that the neurosurgery that she was referred to gives her a call or to have someone give her a call with the number to whoever she was referred to so she can call them to make sure she gets in soon. Please give the patient a call with this information.

## 2024-02-27 NOTE — Telephone Encounter (Signed)
 Left VM for patient to return call to office. Patient is wanting number to [facility].

## 2024-03-02 NOTE — Telephone Encounter (Signed)
 Contacted pt and confirmed number for [facility] office.  No other questions or concerns.

## 2024-03-06 NOTE — Progress Notes (Unsigned)
 Referring Physician:  Dorothe Gaster, NP [address] [place],  [place] [address]  Primary Physician:  Dorothe Gaster, NP  History of Present Illness: 03/06/2024 [name] is here today with a chief complaint of ***  Low back pain that radiates down right leg to foot   Duration: *** Location: *** Quality: *** Severity: ***  Precipitating: aggravated by *** Modifying factors: made better by *** Weakness: none Timing: *** Bowel/Bladder Dysfunction: none  Conservative measures:  Physical therapy: Has participated in PT at [facility]  Multimodal medical therapy including regular antiinflammatories: Meloxicam , Medrol  dosepak, Tizanidine , Gabapentin   Injections: no epidural steroid injections  Past Surgery: none  Mercedes Mcintosh has ***no symptoms of cervical myelopathy.  The symptoms are causing a significant impact on the patient's life.   Review of Systems:  A 10 point review of systems is negative, except for the pertinent positives and negatives detailed in the HPI.  Past Medical History: Past Medical History:  Diagnosis Date   Depression    Osteoporosis     Past Surgical History: Past Surgical History:  Procedure Laterality Date   ABDOMINAL HYSTERECTOMY      Allergies: Allergies as of 03/10/2024 - Review Complete 10/30/2023  Allergen Reaction Noted   Metronidazole Rash 04/11/2015    Medications: Outpatient Encounter Medications as of 03/10/2024  Medication Sig   escitalopram  (LEXAPRO ) 20 MG tablet Take 1 tablet (20 mg total) by mouth daily.   gabapentin  (NEURONTIN ) 300 MG capsule TAKE 1 CAPSULE BY MOUTH EVERY DAY AS NEEDED   meloxicam  (MOBIC ) 7.5 MG tablet Take 1 tablet (7.5 mg total) by mouth daily as needed for pain.   methylPREDNISolone  (MEDROL  DOSEPAK) 4 MG TBPK tablet As directed   tiZANidine  (ZANAFLEX ) 2 MG tablet Take 1 tablet (2 mg total) by mouth every 8 (eight) hours as needed for muscle spasms.   traMADol (ULTRAM) 50 MG tablet  Take 1 tablet by mouth every 6 (six) hours as needed.   traZODone  (DESYREL ) 50 MG tablet TAKE 1 TABLET BY MOUTH EVERY DAY AT BEDTIME AS NEEDED FOR SLEEP   No facility-administered encounter medications on file as of 03/10/2024.    Social History: Social History   Tobacco Use   Smoking status: Former    Current packs/day: 0.00    Average packs/day: 0.1 packs/day for 8.0 years (0.8 ttl pk-yrs)    Types: Cigarettes    Start date: 2004    Quit date: 2012    Years since quitting: 13.4   Smokeless tobacco: Never  Vaping Use   Vaping status: Never Used  Substance Use Topics   Alcohol use: Yes    Comment: twice a month, one drink   Drug use: No    Family Medical History: Family History  Problem Relation Age of Onset   Breast cancer Paternal Aunt    Bursitis Mother    Arthritis Mother    Skin cancer Father    Lung cancer Father        nonsmoker   Lung cancer Paternal Grandfather        nonsmoker    Physical Examination: @VITALWITHPAIN @  General: Patient is well developed, well nourished, calm, collected, and in no apparent distress. Attention to examination is appropriate.  Psychiatric: Patient is non-anxious.  Head:  Pupils equal, round, and reactive to light.  ENT:  Oral mucosa appears well hydrated.  Neck:   Supple.  ***Full range of motion.  Respiratory: Patient is breathing without any difficulty.  Extremities:  No edema.  Vascular: Palpable dorsal pedal pulses.  Skin:   On exposed skin, there are no abnormal skin lesions.  NEUROLOGICAL:     Awake, alert, oriented to person, place, and time.  Speech is clear and fluent. Fund of knowledge is appropriate.   Cranial Nerves: Pupils equal round and reactive to light.  Facial tone is symmetric.  Facial sensation is symmetric.  ROM of spine: ***full.  Palpation of spine: ***non tender.    Strength: Side Biceps Triceps Deltoid Interossei Grip Wrist Ext. Wrist Flex.  R 5 5 5 5 5 5 5   L 5 5 5 5 5 5 5    Side  Iliopsoas Quads Hamstring PF DF EHL  R 5 5 5 5 5 5   L 5 5 5 5 5 5    Reflexes are ***2+ and symmetric at the biceps, triceps, brachioradialis, patella and achilles.   Hoffman's is absent.  Clonus is not present.  Toes are down-going.  Bilateral upper and lower extremity sensation is intact to light touch.    Gait is normal.   No difficulty with tandem gait.   No evidence of dysmetria noted.  Medical Decision Making  Imaging: ***  I have personally reviewed the images and agree with the above interpretation.  Assessment and Plan: Ms. Philemon is a pleasant 73 y.o. female with ***    Thank you for involving me in the care of this patient.   I spent a total of *** minutes in both face-to-face and non-face-to-face activities for this visit on the date of this encounter.   Ludwig Safer, PA-C Dept. of Neurosurgery

## 2024-03-10 ENCOUNTER — Ambulatory Visit (INDEPENDENT_AMBULATORY_CARE_PROVIDER_SITE_OTHER): Admitting: Physician Assistant

## 2024-03-10 ENCOUNTER — Ambulatory Visit
Admission: RE | Admit: 2024-03-10 | Discharge: 2024-03-10 | Disposition: A | Discharge status: Home / Self Care | Source: Ambulatory Visit | Attending: Physician Assistant | Admitting: Physician Assistant

## 2024-03-10 ENCOUNTER — Ambulatory Visit
Admission: RE | Admit: 2024-03-10 | Discharge: 2024-03-10 | Disposition: A | Discharge status: Home / Self Care | Attending: Physician Assistant | Admitting: Physician Assistant

## 2024-03-10 DIAGNOSIS — G8929 Other chronic pain: Secondary | ICD-10-CM

## 2024-03-10 DIAGNOSIS — M549 Dorsalgia, unspecified: Secondary | ICD-10-CM | POA: Diagnosis not present

## 2024-03-10 DIAGNOSIS — M5442 Lumbago with sciatica, left side: Secondary | ICD-10-CM | POA: Insufficient documentation

## 2024-03-10 DIAGNOSIS — M48061 Spinal stenosis, lumbar region without neurogenic claudication: Secondary | ICD-10-CM

## 2024-03-10 DIAGNOSIS — M5441 Lumbago with sciatica, right side: Secondary | ICD-10-CM | POA: Diagnosis not present

## 2024-03-10 DIAGNOSIS — M51369 Other intervertebral disc degeneration, lumbar region without mention of lumbar back pain or lower extremity pain: Secondary | ICD-10-CM | POA: Diagnosis not present

## 2024-03-10 DIAGNOSIS — M47816 Spondylosis without myelopathy or radiculopathy, lumbar region: Secondary | ICD-10-CM | POA: Diagnosis not present

## 2024-03-10 DIAGNOSIS — I700 Atherosclerosis of aorta: Secondary | ICD-10-CM | POA: Diagnosis not present

## 2024-03-10 MED ORDER — TIZANIDINE HCL 2 MG PO TABS: 2.0000 mg | ORAL_TABLET | Freq: Three times a day (TID) | ORAL | 0 refills | Status: DC | PRN

## 2024-03-25 ENCOUNTER — Telehealth: Payer: Self-pay | Admitting: Nurse Practitioner

## 2024-03-25 NOTE — Telephone Encounter (Unsigned)
 Copied from CRM [id number]. Topic: Clinical - Medication Refill >> Mar 25, 2024  1:46 PM Zenovia J wrote: Medication:  meloxicam  (MOBIC ) 7.5 MG tablet   escitalopram  (LEXAPRO ) 20 MG tablet(requesting to up the dose)  gabapentin  (NEURONTIN ) 300 MG capsule  traZODone  (DESYREL ) 50 MG table     Has the patient contacted their pharmacy? No (Agent: If no, request that the patient contact the pharmacy for the refill. If patient does not wish to contact the pharmacy document the reason why and proceed with request.) (Agent: If yes, when and what did the pharmacy advise?)  This is the patient's preferred pharmacy:  [phone] [facility], [place] - [address] [place] [address] Phone: [phone] Fax: [phone]  Is this the correct pharmacy for this prescription? Yes If no, delete pharmacy and type the correct one.   Has the prescription been filled recently? No  Is the patient out of the medication? Yes  Has the patient been seen for an appointment in the last year OR does the patient have an upcoming appointment? Yes  Can we respond through MyChart? Yes  Agent: Please be advised that Rx refills may take up to 3 business days. We ask that you follow-up with your pharmacy.

## 2024-04-01 DIAGNOSIS — H524 Presbyopia: Secondary | ICD-10-CM | POA: Diagnosis not present

## 2024-04-07 ENCOUNTER — Other Ambulatory Visit: Payer: Self-pay | Admitting: Primary Care

## 2024-04-07 ENCOUNTER — Other Ambulatory Visit: Payer: Self-pay | Admitting: Family Medicine

## 2024-04-07 ENCOUNTER — Encounter: Payer: Self-pay | Admitting: Student in an Organized Health Care Education/Training Program

## 2024-04-07 ENCOUNTER — Ambulatory Visit
Attending: Student in an Organized Health Care Education/Training Program | Admitting: Student in an Organized Health Care Education/Training Program

## 2024-04-07 VITALS — BP 101/42 | HR 74 | Temp 98.1°F | Resp 16 | Ht 63.0 in | Wt 114.0 lb

## 2024-04-07 DIAGNOSIS — M5416 Radiculopathy, lumbar region: Secondary | ICD-10-CM | POA: Insufficient documentation

## 2024-04-07 DIAGNOSIS — M5431 Sciatica, right side: Secondary | ICD-10-CM | POA: Diagnosis not present

## 2024-04-07 DIAGNOSIS — M5441 Lumbago with sciatica, right side: Secondary | ICD-10-CM | POA: Insufficient documentation

## 2024-04-07 DIAGNOSIS — G8929 Other chronic pain: Secondary | ICD-10-CM | POA: Diagnosis not present

## 2024-04-07 DIAGNOSIS — F3289 Other specified depressive episodes: Secondary | ICD-10-CM

## 2024-04-07 DIAGNOSIS — M5442 Lumbago with sciatica, left side: Secondary | ICD-10-CM | POA: Insufficient documentation

## 2024-04-07 DIAGNOSIS — M48061 Spinal stenosis, lumbar region without neurogenic claudication: Secondary | ICD-10-CM | POA: Diagnosis not present

## 2024-04-07 MED ORDER — TIZANIDINE HCL 2 MG PO TABS: 2.0000 mg | ORAL_TABLET | Freq: Three times a day (TID) | ORAL | 0 refills | Status: AC | PRN

## 2024-04-07 MED ORDER — GABAPENTIN 300 MG PO CAPS: 600.0000 mg | ORAL_CAPSULE | Freq: Every day | ORAL | 2 refills | Status: DC

## 2024-04-07 NOTE — Progress Notes (Signed)
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 She is hesitant about surgery due to age but will consult neurosurgery based on injection outcomes.  Continue appointment with Dr. Claudene in neurosurgery for further evaluation. Discuss injection outcomes with Dr. Claudene to determine surgical need. Increase gabapentin  to 600 mg at night, with an option to increase to 900 mg if necessary. Consider switching to Lyrica if gabapentin  is ineffective. Refill tizanidine  for muscle spasms.        Procedure Orders         Lumbar Transforaminal Epidural      Pharmacotherapy (current): Medications ordered:  Meds ordered this encounter  Medications   gabapentin  (NEURONTIN ) 300 MG capsule    Sig: Take 2 capsules (600 mg total) by mouth at bedtime.    Dispense:  60 capsule    Refill:  2    NEEDS REFILLS   tiZANidine  (ZANAFLEX ) 2 MG tablet    Sig: Take 1 tablet (2 mg total) by mouth every 8 (eight) hours as needed for muscle spasms.    Dispense:  30 tablet    Refill:  0   Medications administered during this visit: Mercedes Mcintosh had no medications administered during this visit.  Provider-requested follow-up: Return in about 6 days (around 04/13/2024) for Right L4 and L5 TF ESI, in clinic (PO Valium 5mg ).  Future Appointments  Date Time Provider Department Center  04/29/2024  1:00 PM Claudene Penne ORN, MD [facility] None  08/11/2024 10:50 AM [facility] ANNUAL WELLNESS VISIT 2 [facility]   I discussed the assessment and treatment plan with the patient. The patient was provided an opportunity to ask questions and all were answered. The patient agreed with the plan and demonstrated an understanding of the instructions.  Patient advised to call back or seek an in-person evaluation if the symptoms or condition worsens.  Duration of encounter: .  Total time on encounter, as per AMA guidelines included both the face-to-face and non-face-to-face time personally spent by the physician and/or other qualified health care professional(s) on the day of the encounter (includes time in activities that require the physician or other qualified health care professional and does not include time in activities normally performed by clinical staff). Physician's time may include the following activities when performed: Preparing to see the patient (e.g., pre-charting review of records, searching for previously ordered imaging, lab work, and nerve conduction tests) Review of prior analgesic pharmacotherapies. Reviewing PMP Interpreting ordered tests  (e.g., lab work, imaging, nerve conduction tests) Performing post-procedure evaluations, including interpretation of diagnostic procedures Obtaining and/or reviewing separately obtained history Performing a medically appropriate examination and/or evaluation Counseling and educating the patient/family/caregiver Ordering medications, tests, or procedures Referring and communicating with other health care professionals (when not separately reported) Documenting clinical information in the electronic or other health record Independently interpreting results (not separately reported) and communicating results to the patient/ family/caregiver Care coordination (not separately reported)  Note by: Wallie Sherry, MD (TTS and AI technology used. I apologize for any typographical errors that were not detected and corrected.) Date: 04/07/2024; Time: 11:58 AM

## 2024-04-07 NOTE — Progress Notes (Signed)
 Safety precautions to be maintained throughout the outpatient stay will include: orient to surroundings, keep bed in low position, maintain call bell within reach at all times, provide assistance with transfer out of bed and ambulation.

## 2024-04-07 NOTE — Patient Instructions (Addendum)
 Procedure instructions  Do not eat or drink fluids (other than water) for 6 hours before your procedure  No water for 2 hours before your procedure  Take your blood pressure medicine with a sip of water  Arrive 30 minutes before your appointment  Carefully read the "Preparing for your procedure" detailed instructions  If you have questions call us at [phone]  _____________________________________________________________________    ______________________________________________________________________  Preparing for your procedure  Appointments: If you think you may not be able to keep your appointment, call 24-48 hours in advance to cancel. We need time to make it available to others.  During your procedure appointment there will be: No Prescription Refills. No disability issues to discussed. No medication changes or discussions.  Instructions: Food intake: Avoid eating anything solid for at least 8 hours prior to your procedure. Clear liquid intake: You may take clear liquids such as water up to 2 hours prior to your procedure. (No carbonated drinks. No soda.) Transportation: Unless otherwise stated by your physician, bring a driver. Morning Medicines: Except for blood thinners, take all of your other morning medications with a sip of water. Make sure to take your heart and blood pressure medicines. If your blood pressure's lower number is above 100, the case will be rescheduled. Blood thinners: Make sure to stop your blood thinners as instructed.  If you take a blood thinner, but were not instructed to stop it, call our office [phone] and ask to talk to a nurse. Not stopping a blood thinner prior to certain procedures could lead to serious complications. Diabetics on insulin: Notify the staff so that you can be scheduled 1st case in the morning. If your diabetes requires high dose insulin, take only  of your normal insulin dose the morning of the procedure and  notify the staff that you have done so. Preventing infections: Shower with an antibacterial soap the morning of your procedure.  Build-up your immune system: Take 1000 mg of Vitamin C with every meal (3 times a day) the day prior to your procedure. Antibiotics: Inform the nursing staff if you are taking any antibiotics or if you have any conditions that may require antibiotics prior to procedures. (Example: recent joint implants)   Pregnancy: If you are pregnant make sure to notify the nursing staff. Not doing so may result in injury to the fetus, including death.  Sickness: If you have a cold, fever, or any active infections, call and cancel or reschedule your procedure. Receiving steroids while having an infection may result in complications. Arrival: You must be in the facility at least 30 minutes prior to your scheduled procedure. Tardiness: Your scheduled time is also the cutoff time. If you do not arrive at least 15 minutes prior to your procedure, you will be rescheduled.  Children: Do not bring any children with you. Make arrangements to keep them home. Dress appropriately: There is always a possibility that your clothing may get soiled. Avoid long dresses. Valuables: Do not bring any jewelry or valuables.  Reasons to call and reschedule or cancel your procedure: (Following these recommendations will minimize the risk of a serious complication.) Surgeries: Avoid having procedures within 2 weeks of any surgery. (Avoid for 2 weeks before or after any surgery). Flu Shots: Avoid having procedures within 2 weeks of a flu shots or . (Avoid for 2 weeks before or after immunizations). Barium: Avoid having a procedure within 7-10 days after having had a radiological study involving the use of radiological contrast. (  Myelograms, Barium swallow or enema study). Heart attacks: Avoid any elective procedures or surgeries for the initial 6 months after a "Myocardial Infarction" (Heart Attack). Blood  thinners: It is imperative that you stop these medications before procedures. Let us know if you if you take any blood thinner.  Infection: Avoid procedures during or within two weeks of an infection (including chest colds or gastrointestinal problems). Symptoms associated with infections include: Localized redness, fever, chills, night sweats or profuse sweating, burning sensation when voiding, cough, congestion, stuffiness, runny nose, sore throat, diarrhea, nausea, vomiting, cold or Flu symptoms, recent or current infections. It is specially important if the infection is over the area that we intend to treat. Heart and lung problems: Symptoms that may suggest an active cardiopulmonary problem include: cough, chest pain, breathing difficulties or shortness of breath, dizziness, ankle swelling, uncontrolled high or unusually low blood pressure, and/or palpitations. If you are experiencing any of these symptoms, cancel your procedure and contact your primary care physician for an evaluation.  Remember:  Regular Business hours are:  Monday to Thursday 8:00 AM to 4:00 PM  Provider's Schedule: Delano Metz, MD:  Procedure days: Tuesday and Thursday 7:30 AM to 4:00 PM  Edward Jolly, MD:  Procedure days: Monday and Wednesday 7:30 AM to 4:00 PM  Epidural Steroid Injection Patient Information  Description: The epidural space surrounds the nerves as they exit the spinal cord.  In some patients, the nerves can be compressed and inflamed by a bulging disc or a tight spinal canal (spinal stenosis).  By injecting steroids into the epidural space, we can bring irritated nerves into direct contact with a potentially helpful medication.  These steroids act directly on the irritated nerves and can reduce swelling and inflammation which often leads to decreased pain.  Epidural steroids may be injected anywhere along the spine and from the neck to the low back depending upon the location of your pain.   After  numbing the skin with local anesthetic (like Novocaine), a small needle is passed into the epidural space slowly.  You may experience a sensation of pressure while this is being done.  The entire block usually last less than 10 minutes.  Conditions which may be treated by epidural steroids:  Low back and leg pain Neck and arm pain Spinal stenosis Post-laminectomy syndrome Herpes zoster (shingles) pain Pain from compression fractures  Preparation for the injection:  Do not eat any solid food or dairy products within 8 hours of your appointment.  You may drink clear liquids up to 3 hours before appointment.  Clear liquids include water, black coffee, juice or soda.  No milk or cream please. You may take your regular medication, including pain medications, with a sip of water before your appointment  Diabetics should hold regular insulin (if taken separately) and take 1/2 normal NPH dos the morning of the procedure.  Carry some sugar containing items with you to your appointment. A driver must accompany you and be prepared to drive you home after your procedure.  Bring all your current medications with your. An IV may be inserted and sedation may be given at the discretion of the physician.   A blood pressure cuff, EKG and other monitors will often be applied during the procedure.  Some patients may need to have extra oxygen administered for a short period. You will be asked to provide medical information, including your allergies, prior to the procedure.  We must know immediately if you are taking blood thinners (like  Coumadin/Warfarin)  Or if you are allergic to IV iodine contrast (dye). We must know if you could possible be pregnant.  Possible side-effects: Bleeding from needle site Infection (rare, may require surgery) Nerve injury (rare) Numbness & tingling (temporary) Difficulty urinating (rare, temporary) Spinal headache ( a headache worse with upright posture) Light -headedness  (temporary) Pain at injection site (several days) Decreased blood pressure (temporary) Weakness in arm/leg (temporary) Pressure sensation in back/neck (temporary)  Call if you experience: Fever/chills associated with headache or increased back/neck pain. Headache worsened by an upright position. New onset weakness or numbness of an extremity below the injection site Hives or difficulty breathing (go to the emergency room) Inflammation or drainage at the infection site Severe back/neck pain Any new symptoms which are concerning to you  Please note:  Although the local anesthetic injected can often make your back or neck feel good for several hours after the injection, the pain will likely return.  It takes 3-7 days for steroids to work in the epidural space.  You may not notice any pain relief for at least that one week.  If effective, we will often do a series of three injections spaced 3-6 weeks apart to maximally decrease your pain.  After the initial series, we generally will wait several months before considering a repeat injection of the same type.  If you have any questions, please call [phone] [facility]

## 2024-04-13 ENCOUNTER — Encounter: Payer: Self-pay | Admitting: Student in an Organized Health Care Education/Training Program

## 2024-04-13 ENCOUNTER — Ambulatory Visit
Admission: RE | Admit: 2024-04-13 | Discharge: 2024-04-13 | Disposition: A | Discharge status: Home / Self Care | Source: Ambulatory Visit | Attending: Student in an Organized Health Care Education/Training Program | Admitting: Student in an Organized Health Care Education/Training Program

## 2024-04-13 ENCOUNTER — Ambulatory Visit (HOSPITAL_BASED_OUTPATIENT_CLINIC_OR_DEPARTMENT_OTHER): Admitting: Student in an Organized Health Care Education/Training Program

## 2024-04-13 VITALS — BP 105/76 | HR 65 | Temp 98.2°F | Resp 18 | Ht 63.0 in | Wt 114.0 lb

## 2024-04-13 DIAGNOSIS — M48061 Spinal stenosis, lumbar region without neurogenic claudication: Secondary | ICD-10-CM | POA: Diagnosis not present

## 2024-04-13 DIAGNOSIS — G8929 Other chronic pain: Secondary | ICD-10-CM | POA: Insufficient documentation

## 2024-04-13 DIAGNOSIS — M5416 Radiculopathy, lumbar region: Secondary | ICD-10-CM | POA: Insufficient documentation

## 2024-04-13 DIAGNOSIS — M5431 Sciatica, right side: Secondary | ICD-10-CM | POA: Insufficient documentation

## 2024-04-13 MED ORDER — IOHEXOL 180 MG/ML  SOLN
10.0000 mL | Freq: Once | INTRAMUSCULAR | Status: AC
  Administered 2024-04-13: 10 mL via EPIDURAL
  Filled 2024-04-13: qty 20

## 2024-04-13 MED ORDER — SODIUM CHLORIDE 0.9% FLUSH
2.0000 mL | Freq: Once | INTRAVENOUS | Status: AC
  Administered 2024-04-13: 2 mL

## 2024-04-13 MED ORDER — DIAZEPAM 5 MG PO TABS
5.0000 mg | ORAL_TABLET | ORAL | Status: AC
  Administered 2024-04-13: 5 mg via ORAL

## 2024-04-13 MED ORDER — DEXAMETHASONE SODIUM PHOSPHATE 10 MG/ML IJ SOLN
20.0000 mg | Freq: Once | INTRAMUSCULAR | Status: AC
  Administered 2024-04-13: 20 mg
  Filled 2024-04-13: qty 2

## 2024-04-13 MED ORDER — DIAZEPAM 5 MG PO TABS
ORAL_TABLET | ORAL | Status: AC
Start: 2024-04-13 — End: 2024-04-13
  Filled 2024-04-13: qty 1

## 2024-04-13 MED ORDER — LIDOCAINE HCL 2 % IJ SOLN
20.0000 mL | Freq: Once | INTRAMUSCULAR | Status: AC
  Administered 2024-04-13: 100 mg
  Filled 2024-04-13: qty 40

## 2024-04-13 MED ORDER — ROPIVACAINE HCL 2 MG/ML IJ SOLN
2.0000 mL | Freq: Once | INTRAMUSCULAR | Status: AC
  Administered 2024-04-13: 2 mL via EPIDURAL
  Filled 2024-04-13: qty 20

## 2024-04-13 NOTE — Progress Notes (Signed)
 PROVIDER NOTE: Interpretation of information contained herein should be left to medically-trained personnel. Specific patient instructions are provided elsewhere under Patient Instructions section of medical record. This document was created in part using STT-dictation technology, any transcriptional errors that may result from this process are unintentional.  Patient: Mercedes Mcintosh Type: Established DOB: December 28, 1950 MRN: [id number] PCP: Wendee Lynwood HERO, NP  Service: Procedure DOS: 04/13/2024 Setting: Ambulatory Location: Ambulatory outpatient facility Delivery: Face-to-face Provider: Wallie Sherry, MD Specialty: Interventional Pain Management Specialty designation: 09 Location: Outpatient facility Ref. Prov.: Sherry Wallie, MD       Interventional Therapy   Procedure: Lumbar trans-foraminal epidural steroid injection (L-TFESI) #1  Laterality: Right (-RT)  Level: L4 and L5 nerve root(s) Imaging: Fluoroscopy-guided         Anesthesia: Local anesthesia (1-2% Lidocaine ) Sedation: Minimal Sedation                       DOS: 04/13/2024  Performed by: Wallie Sherry, MD  Purpose: Diagnostic/Therapeutic Indications: Lumbar radicular pain severe enough to impact quality of life or function. 1. Chronic radicular lumbar pain   2. Neuroforaminal stenosis of lumbar spine   3. Right sided sciatica    NAS-11 Pain score:   Pre-procedure: 6 /10   Post-procedure: 3/10    Position / Prep / Materials:  Position: Prone  Prep solution: ChloraPrep (2% chlorhexidine gluconate and 70% isopropyl alcohol) Prep Area: Entire Posterior Lumbosacral Area.  From the lower tip of the scapula down to the tailbone and from flank to flank. Materials:  Tray: Block Needle(s):  Type: Spinal  Gauge (G): 22  Length: 3.5-in  Qty: 2     H&P (Pre-op Assessment):  Mercedes Mcintosh is a 73 y.o. (year old), female patient, seen today for interventional treatment. She  has a past surgical history that includes Abdominal  hysterectomy. Mercedes Mcintosh has a current medication list which includes the following prescription(s): cyanocobalamin, escitalopram , gabapentin , meloxicam , multiple vitamins-minerals, and tizanidine . Her primarily concern today is the Back Pain (Lumbar bilateral )  Initial Vital Signs:  Pulse/HCG Rate: 65ECG Heart Rate: 70 Temp: 98.2 F (36.8 C) Resp: 16 BP: (!) 94/42 SpO2: 100 %  BMI: Estimated body mass index is 20.19 kg/m as calculated from the following:   Height as of this encounter: 5' 3 (1.6 m).   Weight as of this encounter: 114 lb (51.7 kg).  Risk Assessment: Allergies: Reviewed. She is allergic to metronidazole.  Allergy Precautions: None required Coagulopathies: Reviewed. None identified.  Blood-thinner therapy: None at this time Active Infection(s): Reviewed. None identified. Mercedes Mcintosh is afebrile  Site Confirmation: Mercedes Mcintosh was asked to confirm the procedure and laterality before marking the site Procedure checklist: Completed Consent: Before the procedure and under the influence of no sedative(s), amnesic(s), or anxiolytics, the patient was informed of the treatment options, risks and possible complications. To fulfill our ethical and legal obligations, as recommended by the American Medical Association's Code of Ethics, I have informed the patient of my clinical impression; the nature and purpose of the treatment or procedure; the risks, benefits, and possible complications of the intervention; the alternatives, including doing nothing; the risk(s) and benefit(s) of the alternative treatment(s) or procedure(s); and the risk(s) and benefit(s) of doing nothing. The patient was provided information about the general risks and possible complications associated with the procedure. These may include, but are not limited to: failure to achieve desired goals, infection, bleeding, organ or nerve damage, allergic reactions, paralysis, and death. In addition,  the patient was informed  of those risks and complications associated to Spine-related procedures, such as failure to decrease pain; infection (i.e.: Meningitis, epidural or intraspinal abscess); bleeding (i.e.: epidural hematoma, subarachnoid hemorrhage, or any other type of intraspinal or peri-dural bleeding); organ or nerve damage (i.e.: Any type of peripheral nerve, nerve root, or spinal cord injury) with subsequent damage to sensory, motor, and/or autonomic systems, resulting in permanent pain, numbness, and/or weakness of one or several areas of the body; allergic reactions; (i.e.: anaphylactic reaction); and/or death. Furthermore, the patient was informed of those risks and complications associated with the medications. These include, but are not limited to: allergic reactions (i.e.: anaphylactic or anaphylactoid reaction(s)); adrenal axis suppression; blood sugar elevation that in diabetics may result in ketoacidosis or comma; water retention that in patients with history of congestive heart failure may result in shortness of breath, pulmonary edema, and decompensation with resultant heart failure; weight gain; swelling or edema; medication-induced neural toxicity; particulate matter embolism and blood vessel occlusion with resultant organ, and/or nervous system infarction; and/or aseptic necrosis of one or more joints. Finally, the patient was informed that Medicine is not an exact science; therefore, there is also the possibility of unforeseen or unpredictable risks and/or possible complications that may result in a catastrophic outcome. The patient indicated having understood very clearly. We have given the patient no guarantees and we have made no promises. Enough time was given to the patient to ask questions, all of which were answered to the patient's satisfaction. Mercedes Mcintosh has indicated that she wanted to continue with the procedure. Attestation: I, the ordering provider, attest that I have discussed with the patient the  benefits, risks, side-effects, alternatives, likelihood of achieving goals, and potential problems during recovery for the procedure that I have provided informed consent. Date  Time: 04/13/2024 11:18 AM  Pre-Procedure Preparation:  Monitoring: As per clinic protocol. Respiration, ETCO2, SpO2, BP, heart rate and rhythm monitor placed and checked for adequate function Safety Precautions: Patient was assessed for positional comfort and pressure points before starting the procedure. Time-out: I initiated and conducted the Time-out before starting the procedure, as per protocol. The patient was asked to participate by confirming the accuracy of the Time Out information. Verification of the correct person, site, and procedure were performed and confirmed by me, the nursing staff, and the patient. Time-out conducted as per Joint Commission's Universal Protocol (UP.01.01.01). Time: 1135 Start Time: 1135 hrs.  Description/Narrative of Procedure:          Target: The 6 o'clock position under the pedicle, on the affected side. Region: Posterolateral Lumbosacral Approach: Posterior Percutaneous Paravertebral approach.  Rationale (medical necessity): procedure needed and proper for the diagnosis and/or treatment of the patient's medical symptoms and needs. Procedural Technique Safety Precautions: Aspiration looking for blood return was conducted prior to all injections. At no point did we inject any substances, as a needle was being advanced. No attempts were made at seeking any paresthesias. Safe injection practices and needle disposal techniques used. Medications properly checked for expiration dates. SDV (single dose vial) medications used. Description of the Procedure: Protocol guidelines were followed. The patient was placed in position over the procedure table. The target area was identified and the area prepped in the usual manner. Skin & deeper tissues infiltrated with local anesthetic.  Appropriate amount of time allowed to pass for local anesthetics to take effect. The procedure needles were then advanced to the target area. Proper needle placement secured. Negative aspiration confirmed. Solution injected in  intermittent fashion, asking for systemic symptoms every 0.5cc of injectate. The needles were then removed and the area cleansed, making sure to leave some of the prepping solution back to take advantage of its long term bactericidal properties.  Vitals:   04/13/24 1113 04/13/24 1135 04/13/24 1142  BP: (!) 94/42 105/76 105/76  Pulse: 65    Resp: 16 18 18   Temp: 98.2 F (36.8 C)    TempSrc: Temporal    SpO2: 100% 97% 98%  Weight: 114 lb (51.7 kg)    Height: 5' 3 (1.6 m)      Start Time: 1135 hrs. End Time: 1142 hrs.  Imaging Guidance (Spinal):          Type of Imaging Technique: Fluoroscopy Guidance (Spinal) Indication(s): Fluoroscopy guidance for needle placement to enhance accuracy in procedures requiring precise needle localization for targeted delivery of medication in or near specific anatomical locations not easily accessible without such real-time imaging assistance. Exposure Time: Please see nurses notes. Contrast: Before injecting any contrast, we confirmed that the patient did not have an allergy to iodine, shellfish, or radiological contrast. Once satisfactory needle placement was completed at the desired level, radiological contrast was injected. Contrast injected under live fluoroscopy. No contrast complications. See chart for type and volume of contrast used. Fluoroscopic Guidance: I was personally present during the use of fluoroscopy. Tunnel Vision Technique used to obtain the best possible view of the target area. Parallax error corrected before commencing the procedure. Direction-depth-direction technique used to introduce the needle under continuous pulsed fluoroscopy. Once target was reached, antero-posterior, oblique, and lateral fluoroscopic  projection used confirm needle placement in all planes. Images permanently stored in EMR. Interpretation: I personally interpreted the imaging intraoperatively. Adequate needle placement confirmed in multiple planes. Appropriate spread of contrast into desired area was observed. No evidence of afferent or efferent intravascular uptake. No intrathecal or subarachnoid spread observed. Permanent images saved into the patient's record.  Post-operative Assessment:  Post-procedure Vital Signs:  Pulse/HCG Rate: 6570 Temp: 98.2 F (36.8 C) Resp: 18 BP: 105/76 SpO2: 98 %  EBL: None  Complications: No immediate post-treatment complications observed by team, or reported by patient.  Note: The patient tolerated the entire procedure well. A repeat set of vitals were taken after the procedure and the patient was kept under observation following institutional policy, for this type of procedure. Post-procedural neurological assessment was performed, showing return to baseline, prior to discharge. The patient was provided with post-procedure discharge instructions, including a section on how to identify potential problems. Should any problems arise concerning this procedure, the patient was given instructions to immediately contact us , at any time, without hesitation. In any case, we plan to contact the patient by telephone for a follow-up status report regarding this interventional procedure.  Comments:  No additional relevant information.  Plan of Care (POC)  Orders:  Orders Placed This Encounter  Procedures   DG PAIN CLINIC C-ARM 1-60 MIN NO REPORT    Intraoperative interpretation by procedural physician at [facility].    Standing Status:   Standing    Number of Occurrences:   1    Reason for exam::   Assistance in needle guidance and placement for procedures requiring needle placement in or near specific anatomical locations not easily accessible without such assistance.    Medications  ordered for procedure: Meds ordered this encounter  Medications   iohexol  (OMNIPAQUE ) 180 MG/ML injection 10 mL    Must be Myelogram-compatible. If not available, you may substitute  with a water-soluble, non-ionic, hypoallergenic, myelogram-compatible radiological contrast medium.   lidocaine  (XYLOCAINE ) 2 % (with pres) injection 400 mg   diazepam  (VALIUM ) tablet 5 mg    Make sure Flumazenil is available in the pyxis when using this medication. If oversedation occurs, administer 0.2 mg IV over 15 sec. If after 45 sec no response, administer 0.2 mg again over 1 min; may repeat at 1 min intervals; not to exceed 4 doses (1 mg)   sodium chloride  flush (NS) 0.9 % injection 2 mL    This is for a two (2) level block. Use two (2) syringes and divide content in half.   ropivacaine  (PF) 2 mg/mL (0.2%) (NAROPIN ) injection 2 mL    This is for a two (2) level block. Use two (2) syringes and divide content in half.   dexamethasone  (DECADRON ) injection 20 mg    This is for a two (2) level block. Use two (2) syringes and divide content in half.   Medications administered: We administered iohexol , lidocaine , diazepam , sodium chloride  flush, ropivacaine  (PF) 2 mg/mL (0.2%), and dexamethasone .  See the medical record for exact dosing, route, and time of administration.    Right L4 and L5 TF ESI 04/13/24    Follow-up plan:   Return in about 4 weeks (around 05/11/2024) for F2F PPE.     Recent Visits Date Type Provider Dept  04/07/24 Office Visit Marcelino Nurse, MD [facility]  Showing recent visits within past 90 days and meeting all other requirements Today's Visits Date Type Provider Dept  04/13/24 Procedure visit Marcelino Nurse, MD [facility]  Showing today's visits and meeting all other requirements Future Appointments No visits were found meeting these conditions. Showing future appointments within next 90 days and meeting all other requirements   Disposition: Discharge home   Discharge (Date  Time): 04/13/2024; 1152 hrs.   Primary Care Physician: Wendee Lynwood HERO, NP Location: [facility] Note by: Nurse Marcelino, MD (TTS technology used. I apologize for any typographical errors that were not detected and corrected.) Date: 04/13/2024; Time: 11:49 AM  Disclaimer:  Medicine is not an Visual merchandiser. The only guarantee in medicine is that nothing is guaranteed. It is important to note that the decision to proceed with this intervention was based on the information collected from the patient. The Data and conclusions were drawn from the patient's questionnaire, the interview, and the physical examination. Because the information was provided in large part by the patient, it cannot be guaranteed that it has not been purposely or unconsciously manipulated. Every effort has been made to obtain as much relevant data as possible for this evaluation. It is important to note that the conclusions that lead to this procedure are derived in large part from the available data. Always take into account that the treatment will also be dependent on availability of resources and existing treatment guidelines, considered by other Pain Management Practitioners as being common knowledge and practice, at the time of the intervention. For Medico-Legal purposes, it is also important to point out that variation in procedural techniques and pharmacological choices are the acceptable norm. The indications, contraindications, technique, and results of the above procedure should only be interpreted and judged by a Board-Certified Interventional Pain Specialist with extensive familiarity and expertise in the same exact procedure and technique.

## 2024-04-13 NOTE — Patient Instructions (Signed)
 Pain Management Discharge Instructions  General Discharge Instructions :  If you need to reach your doctor call: Monday-Friday 8:00 am - 4:00 pm at [phone] or toll free [phone].  After clinic hours [phone] to have operator reach doctor.  Bring all of your medication bottles to all your appointments in the pain clinic.  To cancel or reschedule your appointment with Pain Management please remember to call 24 hours in advance to avoid a fee.  Refer to the educational materials which you have been given on: General Risks, I had my Procedure. Discharge Instructions, Post Sedation.  Post Procedure Instructions:  The drugs you were given will stay in your system until tomorrow, so for the next 24 hours you should not drive, make any legal decisions or drink any alcoholic beverages.  You may eat anything you prefer, but it is better to start with liquids then soups and crackers, and gradually work up to solid foods.  Please notify your doctor immediately if you have any unusual bleeding, trouble breathing or pain that is not related to your normal pain.  Depending on the type of procedure that was done, some parts of your body may feel week and/or numb.  This usually clears up by tonight or the next day.  Walk with the use of an assistive device or accompanied by an adult for the 24 hours.  You may use ice on the affected area for the first 24 hours.  Put ice in a Ziploc bag and cover with a towel and place against area 15 minutes on 15 minutes off.  You may switch to heat after 24 hours.Selective Nerve Root Block Patient Information  Description: Specific nerve roots exit the spinal canal and these nerves can be compressed and inflamed by a bulging disc and bone spurs.  By injecting steroids on the nerve root, we can potentially decrease the inflammation surrounding these nerves, which often leads to decreased pain.  Also, by injecting local anesthesia on the nerve root, this can  provide [place] helpful information to give to your referring doctor if it decreases your pain.  Selective nerve root blocks can be done along the spine from the neck to the low back depending on the location of your pain.   After numbing the skin with local anesthesia, a small needle is passed to the nerve root and the position of the needle is verified using x-ray pictures.  After the needle is in correct position, we then deposit the medication.  You may experience a pressure sensation while this is being done.  The entire block usually lasts less than 15 minutes.  Conditions that may be treated with selective nerve root blocks: Low back and leg pain Spinal stenosis Diagnostic block prior to potential surgery Neck and arm pain Post laminectomy syndrome  Preparation for the injection:  Do not eat any solid food or dairy products within 8 hours of your appointment. You may drink clear liquids up to 3 hours before an appointment.  Clear liquids include water, black coffee, juice or soda.  No milk or cream please. You may take your regular medications, including pain medications, with a sip of water before your appointment.  Diabetics should hold regular insulin (if taken separately) and take 1/2 normal NPH dose the morning of the procedure.  Carry some sugar containing items with you to your appointment. A driver must accompany you and be prepared to drive you home after your procedure. Bring all your current medications with you. An IV  may be inserted and sedation may be given at the discretion of the physician. A blood pressure cuff, EKG, and other monitors will often be applied during the procedure.  Some patients may need to have extra oxygen administered for a short period. You will be asked to provide medical information, including allergies, prior to the procedure.  We must know immediately if you are taking blood  Thinners (like Coumadin) or if you are allergic to IV iodine contrast  (dye).  Possible side-effects: All are usually temporary Bleeding from needle site Light headedness Numbness and tingling Decreased blood pressure Weakness in arms/legs Pressure sensation in back/neck Pain at injection site (several days)  Possible complications: All are extremely rare Infection Nerve injury Spinal headache (a headache wore with upright position)  Call if you experience: Fever/chills associated with headache or increased back/neck pain Headache worsened by an upright position New onset weakness or numbness of an extremity below the injection site Hives or difficulty breathing (go to the emergency room) Inflammation or drainage at the injection site(s) Severe back/neck pain greater than usual New symptoms which are concerning to you  Please note:  Although the local anesthetic injected can often make your back or neck feel good for several hours after the injection the pain will likely return.  It takes 3-5 days for steroids to work on the nerve root. You may not notice any pain relief for at least one week.  If effective, we will often do a series of 3 injections spaced 3-6 weeks apart to maximally decrease your pain.    If you have any questions, please call [phone] [facility]

## 2024-04-13 NOTE — Progress Notes (Signed)
 Safety precautions to be maintained throughout the outpatient stay will include: orient to surroundings, keep bed in low position, maintain call bell within reach at all times, provide assistance with transfer out of bed and ambulation.

## 2024-04-14 ENCOUNTER — Telehealth: Payer: Self-pay | Admitting: *Deleted

## 2024-04-14 DIAGNOSIS — M5459 Other low back pain: Secondary | ICD-10-CM | POA: Diagnosis not present

## 2024-04-14 DIAGNOSIS — R262 Difficulty in walking, not elsewhere classified: Secondary | ICD-10-CM | POA: Diagnosis not present

## 2024-04-14 DIAGNOSIS — M5416 Radiculopathy, lumbar region: Secondary | ICD-10-CM | POA: Diagnosis not present

## 2024-04-14 NOTE — Telephone Encounter (Signed)
 Post procedure call; voicemail left

## 2024-04-17 ENCOUNTER — Ambulatory Visit: Admitting: Nurse Practitioner

## 2024-04-17 NOTE — Progress Notes (Deleted)
   Established Patient Office Visit  Subjective   Patient ID: Mercedes Mcintosh, female    DOB: 04-Aug-1951  Age: 73 y.o. MRN: [id number]  No chief complaint on file.   HPI  Depression: Patient currently maintained on escitalopram  20 mg daily  Chronic back pain: Patient is followed by interventional pain management currently maintained on gabapentin  600 mg nightly patient also take meloxicam  7.5 mg daily as needed  for complete physical and follow up of chronic conditions.  Immunizations: -Tetanus: Completed in? -Influenza: Out of season -Shingles:? -Pneumonia: Completed   Diet: Fair diet.  Exercise: No regular exercise.  Eye exam: Completes annually  Dental exam: Completes semi-annually    Colonoscopy: Completed in? Lung Cancer Screening: N/A    Pap smear: Hysterectomy, aged out  Mammogram:  DEXA:  Sleep:  Advanced directive:     {History (Optional):23778}  ROS    Objective:     There were no vitals taken for this visit. {Vitals History (Optional):23777}  Physical Exam   No results found for any visits on 04/17/24.  {Labs (Optional):23779}  The 10-year ASCVD risk score (Arnett DK, et al., 2019) is: 9.3%    Assessment & Plan:   Problem List Items Addressed This Visit   None   No follow-ups on file.    Adina Crandall, NP

## 2024-04-27 ENCOUNTER — Ambulatory Visit: Admitting: Family Medicine

## 2024-04-28 ENCOUNTER — Ambulatory Visit (INDEPENDENT_AMBULATORY_CARE_PROVIDER_SITE_OTHER): Admitting: Nurse Practitioner

## 2024-04-28 VITALS — BP 100/60 | HR 70 | Temp 98.1°F | Ht 63.0 in | Wt 116.2 lb

## 2024-04-28 DIAGNOSIS — M5441 Lumbago with sciatica, right side: Secondary | ICD-10-CM | POA: Diagnosis not present

## 2024-04-28 DIAGNOSIS — Z87891 Personal history of nicotine dependence: Secondary | ICD-10-CM | POA: Diagnosis not present

## 2024-04-28 DIAGNOSIS — M5442 Lumbago with sciatica, left side: Secondary | ICD-10-CM | POA: Diagnosis not present

## 2024-04-28 DIAGNOSIS — Z0000 Encounter for general adult medical examination without abnormal findings: Secondary | ICD-10-CM

## 2024-04-28 DIAGNOSIS — F331 Major depressive disorder, recurrent, moderate: Secondary | ICD-10-CM | POA: Diagnosis not present

## 2024-04-28 DIAGNOSIS — Z0001 Encounter for general adult medical examination with abnormal findings: Secondary | ICD-10-CM

## 2024-04-28 DIAGNOSIS — G8929 Other chronic pain: Secondary | ICD-10-CM | POA: Diagnosis not present

## 2024-04-28 DIAGNOSIS — Z1231 Encounter for screening mammogram for malignant neoplasm of breast: Secondary | ICD-10-CM | POA: Diagnosis not present

## 2024-04-28 DIAGNOSIS — R7303 Prediabetes: Secondary | ICD-10-CM | POA: Diagnosis not present

## 2024-04-28 DIAGNOSIS — Z1211 Encounter for screening for malignant neoplasm of colon: Secondary | ICD-10-CM | POA: Diagnosis not present

## 2024-04-28 DIAGNOSIS — F3289 Other specified depressive episodes: Secondary | ICD-10-CM

## 2024-04-28 DIAGNOSIS — M810 Age-related osteoporosis without current pathological fracture: Secondary | ICD-10-CM | POA: Diagnosis not present

## 2024-04-28 LAB — TSH: TSH: 0.81 u[IU]/mL (ref 0.35–5.50)

## 2024-04-28 LAB — CBC WITH DIFFERENTIAL/PLATELET
Basophils Absolute: 0 K/uL (ref 0.0–0.1)
Basophils Relative: 0.4 % (ref 0.0–3.0)
Eosinophils Absolute: 0.2 K/uL (ref 0.0–0.7)
Eosinophils Relative: 4.1 % (ref 0.0–5.0)
HCT: 39.5 % (ref 36.0–46.0)
Hemoglobin: 13.2 g/dL (ref 12.0–15.0)
Lymphocytes Relative: 41.1 % (ref 12.0–46.0)
Lymphs Abs: 2.1 K/uL (ref 0.7–4.0)
MCHC: 33.4 g/dL (ref 30.0–36.0)
MCV: 95.4 fl (ref 78.0–100.0)
Monocytes Absolute: 0.5 K/uL (ref 0.1–1.0)
Monocytes Relative: 9.8 % (ref 3.0–12.0)
Neutro Abs: 2.3 K/uL (ref 1.4–7.7)
Neutrophils Relative %: 44.6 % (ref 43.0–77.0)
Platelets: 294 K/uL (ref 150.0–400.0)
RBC: 4.14 Mil/uL (ref 3.87–5.11)
RDW: 12.8 % (ref 11.5–15.5)
WBC: 5.1 K/uL (ref 4.0–10.5)

## 2024-04-28 LAB — COMPREHENSIVE METABOLIC PANEL WITH GFR
ALT: 12 U/L (ref 0–35)
AST: 17 U/L (ref 0–37)
Albumin: 3.7 g/dL (ref 3.5–5.2)
Alkaline Phosphatase: 76 U/L (ref 39–117)
BUN: 9 mg/dL (ref 6–23)
CO2: 27 meq/L (ref 19–32)
Calcium: 8.8 mg/dL (ref 8.4–10.5)
Chloride: 102 meq/L (ref 96–112)
Creatinine, Ser: 0.77 mg/dL (ref 0.40–1.20)
GFR: 77.68 mL/min (ref >60.00)
Glucose, Bld: 72 mg/dL (ref 70–99)
Potassium: 3.8 meq/L (ref 3.5–5.1)
Sodium: 138 meq/L (ref 135–145)
Total Bilirubin: 0.3 mg/dL (ref 0.2–1.2)
Total Protein: 6.3 g/dL (ref 6.0–8.3)

## 2024-04-28 LAB — LIPID PANEL
Cholesterol: 206 mg/dL — ABNORMAL HIGH (ref 0–200)
HDL: 65.3 mg/dL (ref >39.00)
LDL Cholesterol: 117 mg/dL — ABNORMAL HIGH (ref 0–99)
NonHDL: 140.71
Total CHOL/HDL Ratio: 3
Triglycerides: 117 mg/dL (ref 0.0–149.0)
VLDL: 23.4 mg/dL (ref 0.0–40.0)

## 2024-04-28 LAB — HEMOGLOBIN A1C: Hgb A1c MFr Bld: 6 % (ref 4.6–6.5)

## 2024-04-28 LAB — URINALYSIS, MICROSCOPIC ONLY
RBC / HPF: NONE SEEN (ref 0–?)
WBC, UA: NONE SEEN (ref 0–?)

## 2024-04-28 LAB — VITAMIN D 25 HYDROXY (VIT D DEFICIENCY, FRACTURES): VITD: 27.47 ng/mL — ABNORMAL LOW (ref 30.00–100.00)

## 2024-04-28 MED ORDER — MELOXICAM 7.5 MG PO TABS: 7.5000 mg | ORAL_TABLET | Freq: Every day | ORAL | 1 refills | Status: DC | PRN

## 2024-04-28 MED ORDER — ESCITALOPRAM OXALATE 20 MG PO TABS: 20.0000 mg | ORAL_TABLET | Freq: Every day | ORAL | 1 refills | Status: AC

## 2024-04-28 NOTE — Assessment & Plan Note (Signed)
 Patient currently followed by interventional pain management and doing physical therapy.  Maintained on meloxicam  7.5 mg daily as needed, gabapentin  600 mg nightly, tizanidine  2 mg 3 times daily as needed.  Continue follow-up with pain management.  Taking medication as prescribed

## 2024-04-28 NOTE — Progress Notes (Signed)
 Established Patient Office Visit  Subjective   Patient ID: Mercedes Mcintosh, female    DOB: March 06, 1951  Age: 73 y.o. MRN: [id number]  Chief Complaint  Patient presents with   Medication Refill    Escitalopram , Tizanidine  , and meloxicam .    Annual Exam    HPI  Depression: Patient currently maintained on escitalopram  20 mg daily. States that she is going through a lot with family, states that she is awidowm stats that she is in the process of buying a house. States that she would like to see a therapist   Chronic back pain: Patient is followed by interventional pain management currently maintained on gabapentin  600 mg nightly patient also take meloxicam  7.5 mg daily as needed  for complete physical and follow up of chronic conditions.  Immunizations: -Tetanus: Completed in unsure, get a local pharmacy -Influenza: Out of season -Shingles: Discussed in office get a local pharmacy -Pneumonia: Completed   Diet: Fair diet. She is eating well at all. States that she skips breakfast. She will have  coffee and a snack. She will also have a late or dinner.  Exercise: No regular exercise. Has been doing PT and recently completed it. She is doing the exercises at home. 1-2 ties a week   Eye exam: Completes annually. glasses  Dental exam: Completes semi-annually    Colonoscopy: Completed in amb refer to [facility] Screening: N/A    Pap smear: Hysterectomy, aged out  Mammogram: 05/02/2023, due  DEXA: needs new one done 2023, due with history of osteoporosis  Sleep: goes to bed around 10-11 and will get up around 7-730. Sometime she does feel rested and sometimes not  Advanced directive: in the process.  Handout given to patient about living will and healthcare power of attorney       Review of Systems  Constitutional:  Negative for chills and fever.  Respiratory:  Negative for shortness of breath.   Cardiovascular:  Negative for chest pain and leg swelling.   Gastrointestinal:  Negative for abdominal pain, blood in stool, constipation, diarrhea, nausea and vomiting.       BM daily   Genitourinary:  Negative for dysuria and hematuria.  Neurological:  Negative for tingling and headaches.  Psychiatric/Behavioral:  Negative for hallucinations and suicidal ideas.       Objective:     BP 100/60   Pulse 70   Temp 98.1 F (36.7 C) (Oral)   Ht 5' 3 (1.6 m)   Wt 116 lb 3.2 oz (52.7 kg)   SpO2 97%   BMI 20.58 kg/m  BP Readings from Last 3 Encounters:  04/28/24 100/60  04/13/24 105/76  04/07/24 (!) 101/42   Wt Readings from Last 3 Encounters:  04/28/24 116 lb 3.2 oz (52.7 kg)  04/13/24 114 lb (51.7 kg)  04/07/24 114 lb (51.7 kg)   SpO2 Readings from Last 3 Encounters:  04/28/24 97%  04/13/24 98%  04/07/24 98%      Physical Exam Vitals and nursing note reviewed.  Constitutional:      Appearance: Normal appearance.  HENT:     Right Ear: Tympanic membrane, ear canal and external ear normal.     Left Ear: Tympanic membrane, ear canal and external ear normal.     Mouth/Throat:     Mouth: Mucous membranes are moist.     Pharynx: Oropharynx is clear.  Eyes:     Extraocular Movements: Extraocular movements intact.     Pupils: Pupils are equal, round, and  reactive to light.  Cardiovascular:     Rate and Rhythm: Normal rate and regular rhythm.     Pulses: Normal pulses.     Heart sounds: Normal heart sounds.  Pulmonary:     Effort: Pulmonary effort is normal.     Breath sounds: Normal breath sounds.  Abdominal:     General: Bowel sounds are normal. There is no distension.     Palpations: There is no mass.     Tenderness: There is no abdominal tenderness.     Hernia: No hernia is present.  Musculoskeletal:     Right lower leg: No edema.     Left lower leg: No edema.  Lymphadenopathy:     Cervical: No cervical adenopathy.  Skin:    General: Skin is warm.  Neurological:     General: No focal deficit present.     Mental  Status: She is alert.     Deep Tendon Reflexes:     Reflex Scores:      Bicep reflexes are 2+ on the right side and 2+ on the left side.      Patellar reflexes are 2+ on the right side and 2+ on the left side.    Comments: Bilateral upper and lower extremity strength 5/5  Psychiatric:        Mood and Affect: Mood normal.        Behavior: Behavior normal.        Thought Content: Thought content normal.        Judgment: Judgment normal.      No results found for any visits on 04/28/24.    The 10-year ASCVD risk score (Arnett DK, et al., 2019) is: 8.5%    Assessment & Plan:   Problem List Items Addressed This Visit       Nervous and Auditory   Chronic bilateral low back pain with bilateral sciatica   Patient currently followed by interventional pain management and doing physical therapy.  Maintained on meloxicam  7.5 mg daily as needed, gabapentin  600 mg nightly, tizanidine  2 mg 3 times daily as needed.  Continue follow-up with pain management.  Taking medication as prescribed      Relevant Medications   escitalopram  (LEXAPRO ) 20 MG tablet   meloxicam  (MOBIC ) 7.5 MG tablet     Musculoskeletal and Integument   Osteoporosis of forearm   History of the same patient is due for DEXA scan.  DEXA scan ordered patient given information to call and set up      Relevant Orders   TSH   VITAMIN D  25 Hydroxy (Vit-D Deficiency, Fractures)   DG Bone Density     Other   Prediabetes   History of the same.  Pending A1c      Relevant Orders   Hemoglobin A1c   TSH   Lipid panel   Major depressive disorder, recurrent (HCC)   Currently maintained on Lexapro  20 mg daily.  Has been out for approximately 1.5 weeks.  Refill provided patient denies HI/SI/AVH.  Ambs referral to psychology for therapy      Relevant Medications   escitalopram  (LEXAPRO ) 20 MG tablet   Other Relevant Orders   Ambulatory referral to Psychology   Preventative health care - Primary   Discussed  age-appropriate immunizations and screening exams.  Did review patient's personal, surgical, social, family histories.  Patient is up-to-date on all age-appropriate vaccinations are like.  She will get a tetanus vaccine in singles vaccine at pharmacy.  Ambulatory referral to gastroenterology  for CRC screening.  Mammogram ordered today for breast cancer screening.  Patient was given information to call and set up along with bone density scan for already known osteoporosis.  Patient was given permission at discharge about preventative healthcare maintenance with anticipatory guidance      Relevant Orders   CBC with Differential/Platelet   Comprehensive metabolic panel with GFR   TSH   Former smoker   Pending urine microscopy to rule out microscopic hematuria.      Relevant Orders   Urine Microscopic   Other Visit Diagnoses       Screening for colon cancer       Relevant Orders   Ambulatory referral to Gastroenterology     Screening mammogram for breast cancer       Relevant Orders   MM 3D SCREENING MAMMOGRAM BILATERAL BREAST     Other specified depressive episodes       Relevant Medications   escitalopram  (LEXAPRO ) 20 MG tablet       Return in about 3 months (around 07/29/2024) for MDD.    Adina Crandall, NP

## 2024-04-28 NOTE — Assessment & Plan Note (Signed)
 History of the same patient is due for DEXA scan.  DEXA scan ordered patient given information to call and set up

## 2024-04-28 NOTE — Patient Instructions (Signed)
 Nice to see you today  Consider getting your tetanus (TDAP) and your shingles vaccine (shingrix) at the pharmacy   Call and schedule the mammogram and bone density scan at   [facility] [address] ( on hospital grounds) [place], [place]  [phone]

## 2024-04-28 NOTE — Assessment & Plan Note (Signed)
 Currently maintained on Lexapro  20 mg daily.  Has been out for approximately 1.5 weeks.  Refill provided patient denies HI/SI/AVH.  Ambs referral to psychology for therapy

## 2024-04-28 NOTE — Assessment & Plan Note (Signed)
 Pending urine microscopy to rule out microscopic hematuria

## 2024-04-28 NOTE — Progress Notes (Deleted)
 Referring Physician:  Wendee Lynwood HERO, NP [address] [place],  [place] [address]  Primary Physician:  Mercedes Lynwood HERO, NP  History of Present Illness: 04/28/2024 [name] is here today with a chief complaint of ***  03/10/24 Note from [place], [place] [name] is here today with a chief complaint of low back pain rating down bilateral lower extremities.  In her right leg extends down to her ankle and is constant in nature, it is intermittent in her left leg.  She continues to be able to walk half a mile to a mile, but will start to have pain in her right leg and low back.  She has been having to use a back brace and a heating pad.  She has not noticed that if she leans forward it helps much with her pain.  Patient also reports.  Some occasional cramping/muscle spasms in her legs.  It does feel as though her legs feel more tired than usual.  Has been taking gabapentin  and Mobic  has become progressively worse over the past 2 years.  She denies any weakness at this time.     Duration: 2 years   Severity: 8/10  Precipitating: aggravated by standing or sitting for to long Modifying factors: made better by heating pad, medications at times Weakness: none Timing: constant Bowel/Bladder Dysfunction: none   Conservative measures:  Physical therapy: Has participated in PT at [facility] [place] and did some traction  Multimodal medical therapy including regular antiinflammatories: Meloxicam , Medrol  dosepak, Tizanidine , Gabapentin   Injections: 1 injection at [facility] about 8 years   Past Surgery: no spinal surgeries  Mercedes Mcintosh has ***no symptoms of cervical myelopathy.  The symptoms are causing a significant impact on the patient's life.   I have utilized the care everywhere function in epic to review the outside records available from external health systems.  Review of Systems:  A 10 point review of systems is negative, except for the pertinent positives and  negatives detailed in the HPI.  Past Medical History: Past Medical History:  Diagnosis Date   Depression    Osteoporosis     Past Surgical History: Past Surgical History:  Procedure Laterality Date   ABDOMINAL HYSTERECTOMY      Allergies: Allergies as of 04/29/2024 - Review Complete 04/13/2024  Allergen Reaction Noted   Metronidazole Rash 04/11/2015    Medications:  Current Outpatient Medications:    cyanocobalamin (VITAMIN B12) 1000 MCG tablet, Take 1,000 mcg by mouth daily., Disp: , Rfl:    escitalopram  (LEXAPRO ) 20 MG tablet, Take 1 tablet (20 mg total) by mouth daily., Disp: 90 tablet, Rfl: 1   gabapentin  (NEURONTIN ) 300 MG capsule, Take 2 capsules (600 mg total) by mouth at bedtime., Disp: 60 capsule, Rfl: 2   meloxicam  (MOBIC ) 7.5 MG tablet, Take 1 tablet (7.5 mg total) by mouth daily as needed for pain., Disp: 30 tablet, Rfl: 1   Multiple Vitamins-Minerals (CENTRUM SILVER 50+WOMEN PO), Take by mouth., Disp: , Rfl:    tiZANidine  (ZANAFLEX ) 2 MG tablet, Take 1 tablet (2 mg total) by mouth every 8 (eight) hours as needed for muscle spasms., Disp: 30 tablet, Rfl: 0  Social History: Social History   Tobacco Use   Smoking status: Former    Current packs/day: 0.00    Average packs/day: 0.1 packs/day for 8.0 years (0.8 ttl pk-yrs)    Types: Cigarettes    Start date: 2004    Quit date: 2012  Years since quitting: 13.6   Smokeless tobacco: Never  Vaping Use   Vaping status: Never Used  Substance Use Topics   Alcohol use: Yes    Comment: twice a month, one drink   Drug use: No    Family Medical History: Family History  Problem Relation Age of Onset   Breast cancer Paternal Aunt    Bursitis Mother    Arthritis Mother    Skin cancer Father    Lung cancer Father        nonsmoker   Lung cancer Paternal Grandfather        nonsmoker    Physical Examination: There were no vitals filed for this visit.  General: Patient is in no apparent distress. Attention to  examination is appropriate.  Neck:   Supple.  Full range of motion.  Respiratory: Patient is breathing without any difficulty.   NEUROLOGICAL:     Awake, alert, oriented to person, place, and time.  Speech is clear and fluent.   Cranial Nerves: Pupils equal round and reactive to light.  Facial tone is symmetric.  Facial sensation is symmetric. Shoulder shrug is symmetric. Tongue protrusion is midline.    Strength: Side Biceps Triceps Deltoid Interossei Grip Wrist Ext. Wrist Flex.  R 5 5 5 5 5 5 5   L 5 5 5 5 5 5 5    Side Iliopsoas Quads Hamstring PF DF EHL  R 5 5 5 5 5 5   L 5 5 5 5 5 5    Reflexes are ***2+ and symmetric at the biceps, triceps, brachioradialis, patella and achilles.   Hoffman's is absent. Clonus is absent  Bilateral upper and lower extremity sensation is intact to light touch ***.     No evidence of dysmetria noted.  Gait is normal.    Imaging: *** I have personally reviewed the images and agree with the above interpretation.  Medical Decision Making/Assessment and Plan: [name] is a pleasant 73 y.o. female with ***  There are no diagnoses linked to this encounter.   Thank you for involving me in the care of this patient.    Mercedes MICAEL Sharps MD/MSCR Neurosurgery

## 2024-04-28 NOTE — Assessment & Plan Note (Signed)
History of the same.  Pending A1c

## 2024-04-28 NOTE — Assessment & Plan Note (Signed)
 Discussed age-appropriate immunizations and screening exams.  Did review patient's personal, surgical, social, family histories.  Patient is up-to-date on all age-appropriate vaccinations are like.  She will get a tetanus vaccine in singles vaccine at pharmacy.  Ambulatory referral to gastroenterology for CRC screening.  Mammogram ordered today for breast cancer screening.  Patient was given information to call and set up along with bone density scan for already known osteoporosis.  Patient was given permission at discharge about preventative healthcare maintenance with anticipatory guidance

## 2024-04-29 ENCOUNTER — Ambulatory Visit: Admitting: Neurosurgery

## 2024-04-30 ENCOUNTER — Ambulatory Visit: Payer: Self-pay | Admitting: Nurse Practitioner

## 2024-05-05 ENCOUNTER — Ambulatory Visit
Attending: Student in an Organized Health Care Education/Training Program | Admitting: Student in an Organized Health Care Education/Training Program

## 2024-05-05 ENCOUNTER — Encounter: Payer: Self-pay | Admitting: Student in an Organized Health Care Education/Training Program

## 2024-05-05 VITALS — BP 94/48 | HR 67 | Temp 98.4°F | Resp 16 | Ht 63.0 in | Wt 114.0 lb

## 2024-05-05 DIAGNOSIS — M5416 Radiculopathy, lumbar region: Secondary | ICD-10-CM | POA: Insufficient documentation

## 2024-05-05 DIAGNOSIS — M5431 Sciatica, right side: Secondary | ICD-10-CM | POA: Diagnosis not present

## 2024-05-05 DIAGNOSIS — G8929 Other chronic pain: Secondary | ICD-10-CM | POA: Insufficient documentation

## 2024-05-05 DIAGNOSIS — M48061 Spinal stenosis, lumbar region without neurogenic claudication: Secondary | ICD-10-CM | POA: Diagnosis not present

## 2024-05-05 NOTE — Progress Notes (Signed)
 PROVIDER NOTE: Interpretation of information contained herein should be left to medically-trained personnel. Specific patient instructions are provided elsewhere under Patient Instructions section of medical record. This document was created in part using AI and STT-dictation technology, any transcriptional errors that may result from this process are unintentional.  Patient: Mercedes Mcintosh  Service: E/M   PCP: Wendee Lynwood HERO, NP  DOB: 06-29-1951  DOS: 05/05/2024  Provider: Wallie Sherry, MD  MRN: [id number]  Delivery: Face-to-face  Specialty: Interventional Pain Management  Type: Established Patient  Setting: Ambulatory outpatient facility  Specialty designation: 09  Referring Prov.: Wendee Lynwood HERO, NP  Location: Outpatient office facility       History of present illness (HPI) Mercedes Mcintosh, a 73 y.o. 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 Mercedes Mcintosh primary complain today is Back Pain (Lower)    Pain Assessment: Severity of Chronic pain is reported as a 8 /10. Location: Back Lower, Right, Left/to right hip and down right leg to ankle. Onset: More than a month ago. Quality: Aching, Sharp, Throbbing, Shooting, Constant. Timing: Constant. Modifying factor(s): physical therapy. Vitals:  height is 5' 3 (1.6 m) and weight is 114 lb (51.7 kg). Her temperature is 98.4 F (36.9 C). Her blood pressure is 94/48 (abnormal) and her pulse is 67. Her respiration is 16 and oxygen saturation is 100%.  BMI: Estimated body mass index is 20.19 kg/m as calculated from the following:   Height as of this encounter: 5' 3 (1.6 m).   Weight as of this encounter: 114 lb (51.7 kg).  Last encounter: 04/07/2024. Last procedure: 04/13/2024.  Reason for encounter:   Post-Procedure Evaluation   Procedure: Lumbar trans-foraminal epidural steroid injection (L-TFESI) #1  Laterality: Right (-RT)  Level: L4 and L5 nerve root(s) Imaging: Fluoroscopy-guided          Anesthesia: Local anesthesia (1-2% Lidocaine ) Sedation: Minimal Sedation                       DOS: 04/13/2024  Performed by: Wallie Sherry, MD  Purpose: Diagnostic/Therapeutic Indications: Lumbar radicular pain severe enough to impact quality of life or function. 1. Chronic radicular lumbar pain   2. Neuroforaminal stenosis of lumbar spine   3. Right sided sciatica    NAS-11 Pain score:   Pre-procedure: 6 /10   Post-procedure: 3/10    Effectiveness:  Initial hour after procedure: 75 %  Subsequent 4-6 hours post-procedure: 50 %  Analgesia past initial 6 hours: 75 %  Ongoing improvement:  Analgesic:  75% Function: Somewhat improved ROM: Somewhat improved   ROS  Constitutional: Denies any fever or chills Gastrointestinal: No reported hemesis, hematochezia, vomiting, or acute GI distress Musculoskeletal: Denies any acute onset joint swelling, redness, loss of ROM, or weakness Neurological: No reported episodes of acute onset apraxia, aphasia, dysarthria, agnosia, amnesia, paralysis, loss of coordination, or loss of consciousness  Medication Review  Multiple Vitamins-Minerals, cyanocobalamin, escitalopram , gabapentin , meloxicam , and tiZANidine   History Review  Allergy: Mercedes Mcintosh is allergic to metronidazole. Drug: Mercedes Mcintosh  reports no history of drug use. Alcohol:  reports current alcohol use. Tobacco:  reports that she quit smoking about 13 years ago. Her smoking use included cigarettes. She started smoking about 21 years ago. She has a 0.8 pack-year smoking history. She has never used smokeless tobacco. Social: Mercedes Mcintosh  reports that she quit smoking about 13 years ago. Her smoking use included cigarettes. She started smoking about  21 years ago. She has a 0.8 pack-year smoking history. She has never used smokeless tobacco. She reports current alcohol use. She reports that she does not use drugs. Medical:  has a past medical history of Depression and  Osteoporosis. Surgical: Mercedes Mcintosh  has a past surgical history that includes Abdominal hysterectomy. Family: family history includes Arthritis in her mother; Breast cancer in her paternal aunt; Bursitis in her mother; Lung cancer in her father and paternal grandfather; Skin cancer in her father.  Laboratory Chemistry Profile   Renal Lab Results  Component Value Date   BUN 9 04/28/2024   CREATININE 0.77 04/28/2024   BCR SEE NOTE: 04/05/2023   GFR 77.68 04/28/2024   GFRAA >60 09/01/2013   GFRNONAA >60 09/20/2021    Hepatic Lab Results  Component Value Date   AST 17 04/28/2024   ALT 12 04/28/2024   ALBUMIN 3.7 04/28/2024   ALKPHOS 76 04/28/2024    Electrolytes Lab Results  Component Value Date   NA 138 04/28/2024   K 3.8 04/28/2024   CL 102 04/28/2024   CALCIUM 8.8 04/28/2024    Bone Lab Results  Component Value Date   VD25OH 27.47 (L) 04/28/2024    Inflammation (CRP: Acute Phase) (ESR: Chronic Phase) No results found for: CRP, ESRSEDRATE, LATICACIDVEN       Note: Above Lab results reviewed.    Physical Exam  Vitals: BP (!) 94/48   Pulse 67   Temp 98.4 F (36.9 C)   Resp 16   Ht 5' 3 (1.6 m)   Wt 114 lb (51.7 kg)   SpO2 100%   BMI 20.19 kg/m  BMI: Estimated body mass index is 20.19 kg/m as calculated from the following:   Height as of this encounter: 5' 3 (1.6 m).   Weight as of this encounter: 114 lb (51.7 kg). Ideal: Ideal body weight: 52.4 kg (115 lb 8.3 oz) General appearance: Well nourished, well developed, and well hydrated. In no apparent acute distress Mental status: Alert, oriented x 3 (person, place, & time)       Respiratory: No evidence of acute respiratory distress Eyes: PERLA   Assessment   Diagnosis Status  1. Chronic radicular lumbar pain   2. Neuroforaminal stenosis of lumbar spine   3. Right sided sciatica    Controlled Controlled Controlled     Plan of Care  Excellent response after right L4 and L5 transforaminal  ESI.  Notes approximately 70 to 75% pain relief.  Encouraged her to continue to monitor her symptoms and follow-up as needed.  As needed order placed as below for repeat lumbar transforaminal ESI. Orders:  Orders Placed This Encounter  Procedures   Lumbar Transforaminal Epidural    Standing Status:   Standing    Number of Occurrences:   1    Next Expected Occurrence:   07/24/2024    Expiration Date:   11/05/2024    Scheduling Instructions:     Laterality: Right L4 and L5          Sedation: Patient's choice.     PRN    Where will this procedure be performed?:   [facility]     Right L4 and L5 TF ESI 04/13/24   Return for PRN L-TFESI.    Recent Visits Date Type Provider Dept  04/13/24 Procedure visit Marcelino Nurse, MD [facility]  04/07/24 Office Visit Marcelino Nurse, MD [facility]  Showing recent visits within past 90 days and meeting all other  requirements Today's Visits Date Type Provider Dept  05/05/24 Office Visit Marcelino Nurse, MD [facility]  Showing today's visits and meeting all other requirements Future Appointments No visits were found meeting these conditions. Showing future appointments within next 90 days and meeting all other requirements  I discussed the assessment and treatment plan with the patient. The patient was provided an opportunity to ask questions and all were answered. The patient agreed with the plan and demonstrated an understanding of the instructions.  Patient advised to call back or seek an in-person evaluation if the symptoms or condition worsens.  Duration of encounter: 15 minutes.  Total time on encounter, as per AMA guidelines included both the face-to-face and non-face-to-face time personally spent by the physician and/or other qualified health care professional(s) on the day of the encounter (includes time in activities that require the physician or other qualified health care professional and does not  include time in activities normally performed by clinical staff). Physician's time may include the following activities when performed: Preparing to see the patient (e.g., pre-charting review of records, searching for previously ordered imaging, lab work, and nerve conduction tests) Review of prior analgesic pharmacotherapies. Reviewing PMP Interpreting ordered tests (e.g., lab work, imaging, nerve conduction tests) Performing post-procedure evaluations, including interpretation of diagnostic procedures Obtaining and/or reviewing separately obtained history Performing a medically appropriate examination and/or evaluation Counseling and educating the patient/family/caregiver Ordering medications, tests, or procedures Referring and communicating with other health care professionals (when not separately reported) Documenting clinical information in the electronic or other health record Independently interpreting results (not separately reported) and communicating results to the patient/ family/caregiver Care coordination (not separately reported)  Note by: Nurse Marcelino, MD (TTS and AI technology used. I apologize for any typographical errors that were not detected and corrected.) Date: 05/05/2024; Time: 3:14 PM

## 2024-05-12 ENCOUNTER — Ambulatory Visit: Admitting: Student in an Organized Health Care Education/Training Program

## 2024-05-20 ENCOUNTER — Ambulatory Visit: Admitting: Neurosurgery

## 2024-06-15 ENCOUNTER — Ambulatory Visit: Admitting: Neurosurgery

## 2024-07-12 ENCOUNTER — Other Ambulatory Visit: Payer: Self-pay | Admitting: Nurse Practitioner

## 2024-07-12 ENCOUNTER — Other Ambulatory Visit: Payer: Self-pay | Admitting: Student in an Organized Health Care Education/Training Program

## 2024-07-12 DIAGNOSIS — G8929 Other chronic pain: Secondary | ICD-10-CM

## 2024-07-12 DIAGNOSIS — M48061 Spinal stenosis, lumbar region without neurogenic claudication: Secondary | ICD-10-CM

## 2024-07-29 ENCOUNTER — Ambulatory Visit: Admitting: Nurse Practitioner

## 2024-08-11 ENCOUNTER — Encounter

## 2024-08-12 DIAGNOSIS — Z0100 Encounter for examination of eyes and vision without abnormal findings: Secondary | ICD-10-CM | POA: Diagnosis not present

## 2024-08-17 ENCOUNTER — Ambulatory Visit: Payer: Self-pay

## 2024-08-17 NOTE — Telephone Encounter (Signed)
 Last I see is that she missed an appointment with Dr. Penne Sharps with neurosurgery on 06/15/2024. She can call and reschedule at [phone]

## 2024-08-17 NOTE — Telephone Encounter (Signed)
 Called pt. No answer and Unable to leave voicemail.

## 2024-08-17 NOTE — Telephone Encounter (Signed)
 Call dropped during warm transfer to NT attempted to call patient back x 1. LVM for patient to return call to [phone]. Routed to callbacks  Copied from CRM #[id number]. Topic: Clinical - Red Word Triage >> Aug 17, 2024 11:00 AM Lonell PEDLAR wrote: Red Word that prompted transfer to Nurse Triage: lower back and leg pain

## 2024-08-17 NOTE — Telephone Encounter (Signed)
 FYI Only or Action Required?: Action required by provider: request for appointment and clinical question for provider.  Patient was last seen in primary care on 04/28/2024 by Wendee Lynwood HERO, NP.  Called Nurse Triage reporting No chief complaint on file..  Symptoms began several weeks ago.  Interventions attempted: Rest, hydration, or home remedies and Ice/heat application.  Symptoms are: gradually worsening.  Triage Disposition: No disposition on file.  Patient/caregiver understands and will follow disposition?:  Reason for Disposition . Requesting regular office appointment  Answer Assessment - Initial Assessment Questions 1. REASON FOR CALL: What is the main reason for your call? or How can I best help you?     I need to make an appointment to get back in with Neurology as I missed my last appointment with them.   2. SYMPTOMS : Do you have any symptoms?      No new symptoms  3. OTHER QUESTIONS: Do you have any other questions?     No.  Protocols used: Information Only Call - No Triage-A-AH

## 2024-08-17 NOTE — Telephone Encounter (Signed)
 This RN attempted to contact patient for triage. No answer, voicemail left requesting return call to clinic.

## 2024-08-18 NOTE — Telephone Encounter (Signed)
 Contacted and spoke with patient to relay information. Pt states that she called neuro yesterday and has an appointment with them tomorrow.  No further questions or concerns.

## 2024-08-19 ENCOUNTER — Encounter: Payer: Self-pay | Admitting: Family Medicine

## 2024-08-19 ENCOUNTER — Ambulatory Visit: Admitting: Family Medicine

## 2024-08-19 VITALS — BP 90/60 | HR 70 | Temp 97.1°F | Ht 63.0 in | Wt 114.1 lb

## 2024-08-19 DIAGNOSIS — M5442 Lumbago with sciatica, left side: Secondary | ICD-10-CM | POA: Diagnosis not present

## 2024-08-19 DIAGNOSIS — M5441 Lumbago with sciatica, right side: Secondary | ICD-10-CM | POA: Diagnosis not present

## 2024-08-19 DIAGNOSIS — M48061 Spinal stenosis, lumbar region without neurogenic claudication: Secondary | ICD-10-CM

## 2024-08-19 DIAGNOSIS — G8929 Other chronic pain: Secondary | ICD-10-CM | POA: Diagnosis not present

## 2024-08-19 MED ORDER — PREDNISONE 20 MG PO TABS: ORAL_TABLET | ORAL | 0 refills | Status: DC

## 2024-08-19 NOTE — Progress Notes (Unsigned)
 Mercedes Hott T. Murial Beam, MD, CAQ Sports Medicine [facility] at [facility] [address] [place], [address]  Phone: [phone]  FAX: [phone]  Mercedes Mcintosh - 73 y.o. female  MRN [id number]  Date of Birth: 27-Jun-1951  Date: 08/19/2024  PCP: Mercedes Lynwood HERO, NP  Referral: Mercedes Lynwood HERO, NP  Chief Complaint  Patient presents with   Back Pain    Radiates down legs   Ticking sound in Right Ear at night   Subjective:   Mercedes Mcintosh is a 73 y.o. very pleasant female patient with Body mass index is 20.22 kg/m. who presents with the following:  Discussed the use of AI scribe software for clinical note transcription with the patient, who gave verbal consent to proceed.  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 Marcelino She has already seen Mercedes Mcintosh Neurosurgery PT with [facility] at [facility] History of Present Illness Mercedes Mcintosh is a 73 year old female with severe spinal stenosis and neuroforaminal stenosis who presents with worsening back and leg pain.  She experiences severe lower back pain that initially affected her right leg from the hip to the ankle and has now worsened, spreading to both legs and the buttocks. The pain is severe, making it difficult for her to stand. Her symptoms have been exacerbated by her new job at [employer], which requires frequent standing.  She reports being told by her doctors that she has stenosis and arthritis in her back. She previously received a spine injection in July, which provided some relief, but her symptoms have since worsened. 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  Her current medication regimen includes gabapentin , which she takes at night due to its sedative effects. She takes two doses at night and avoids daytime doses to  prevent drowsiness while working.   She also experiences a persistent 'ticking' noise in her ear, primarily at night, which is annoying but not painful.  Review of Systems is noted in the HPI, as appropriate  Objective:   BP 90/60   Pulse 70   Temp (!) 97.1 F (36.2 C) (Temporal)   Ht 5' 3 (1.6 m)   Wt 114 lb 2 oz (51.8 kg)   SpO2 97%   BMI 20.22 kg/m   GEN: No acute distress; alert,appropriate. PULM: Breathing comfortably in no respiratory distress PSYCH: Normally interactive.    Range of motion at  the waist: Flexion, extension, lateral bending and rotation: Mild restriction of motion, minimal pain with extension or lateral bending.  No echymosis or edema Rises to examination table with mild difficulty Gait: minimally antalgic  Inspection/Deformity: N Paraspinus Tenderness: L3-S1 bilaterally  B Ankle Dorsiflexion (L5,4): 5/5 B Great Toe Dorsiflexion (L5,4): 5/5 Heel Walk (L5): WNL Toe Walk (S1): WNL Rise/Squat (L4): WNL, mild pain  SENSORY B Medial Foot (L4): WNL B Dorsum (L5): WNL B Lateral (S1): WNL Light Touch: WNL Pinprick: WNL  REFLEXES Knee (L4): 2+ Ankle (S1): 2+  B SLR, seated: neg B SLR, supine: Back pain B FABER: neg B Reverse FABER: neg B Greater Troch: NT B Log Roll: neg B Sciatic Notch: NT   Laboratory and Imaging Data: CLINICAL DATA:  Low back pain goes into both legs   EXAM: MRI LUMBAR SPINE WITHOUT CONTRAST   TECHNIQUE: Multiplanar, multisequence MR imaging of the lumbar  spine was performed. No intravenous contrast was administered.   COMPARISON:  None Available.   FINDINGS: Segmentation: Standard.   Alignment:  Physiologic lumbar alignment is maintained.   Vertebrae: Degenerative endplate marrow changes at a few levels. No compression fractures.   Conus medullaris and cauda equina: The conus medullaris terminates at the level of L1-L2. The distal spinal cord signal intensity is normal.   Paraspinal and other soft  tissues: The visualized abdomen and pelvis show no soft tissue abnormality. The visualized aorta is normal.   Disc levels:   L1-L2: Mild disc bulge. Moderate facet arthropathy. No neuroforaminal stenosis. No spinal canal stenosis.   L2-L3: Disc bulge. Moderate bilateral facet arthropathy. Mild left neuroforaminal stenosis. No spinal canal stenosis.   L3-L4: Disc bulge. Moderate bilateral facet arthropathy. Mild bilateral neuroforaminal stenosis. Mild spinal canal stenosis.   L4-L5: Disc bulge. Moderate bilateral facet arthropathy. Severe left and mild right neuroforaminal stenosis. Mild spinal canal stenosis.   L5-S1: Disc bulge. Mild bilateral facet arthropathy. Severe bilateral neuroforaminal stenosis. Mild spinal canal stenosis.   IMPRESSION: 1. Severe neuroforaminal stenosis on the left at L4-L5 and bilaterally at L5-S1 secondary to disc bulging and facet arthropathy. 2. Mild canal stenosis at L3-L4, L4-L5, and L5-S1.     Electronically Signed   By: Clem Savory M.D.   On: 02/24/2024 11:06      Assessment and Plan:     ICD-10-CM   1. Neuroforaminal stenosis of lumbar spine  M48.061     2. 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 Gabapentin  causes drowsiness. Neurosurgery and pain management involved. Conservative management includes physical therapy and spine injections, which she has already done. - Prescribed 10 days of prednisone  for pain management. - Continue gabapentin  at current dose, avoid daytime use. - Avoid meloxicam  while on prednisone . - Maintain neurosurgery appointment on January 7th.  -The patient is already seeing neurosurgery and interventional pain.  I do not think I add anything additional to this case.  I will  sign off, and no need to follow-up with me.  Tinnitus, right greater than left Chronic tinnitus, primarily in the right ear, described as a ticking noise, especially noticeable when lying down.  - Reassured that tinnitus is common and may resolve, but at times it can be indefinite.  Medication Management during today's office visit: Meds ordered this encounter  Medications   predniSONE  (DELTASONE ) 20 MG tablet    Sig: 2 tabs po daily for 5 days, then 1 tab po daily for 5 days    Dispense:  15 tablet    Refill:  0   There are no discontinued medications.  Orders placed today for conditions managed today: No orders of the defined types were placed in this encounter.   Disposition: No follow-ups on file.  [employer] One speech-to-text software was used for transcription in this dictation.  Possible transcriptional errors can occur using Animal nutritionist.   Signed,  Jacques DASEN. Nuala Chiles, MD   Outpatient Encounter Medications as of 08/19/2024  Medication Sig   predniSONE  (DELTASONE ) 20 MG tablet 2 tabs po daily for 5 days, then 1 tab po daily for 5 days   cyanocobalamin (VITAMIN B12) 1000 MCG tablet Take 1,000 mcg by mouth daily.   escitalopram  (LEXAPRO )  20 MG tablet Take 1 tablet (20 mg total) by mouth daily.   gabapentin  (NEURONTIN ) 300 MG capsule Take 2 capsules (600 mg total) by mouth at bedtime.   meloxicam  (MOBIC ) 7.5 MG tablet TAKE 1 TABLET BY MOUTH DAILY AS NEEDED FOR PAIN   Multiple Vitamins-Minerals (CENTRUM SILVER 50+WOMEN PO) Take by mouth.   tiZANidine  (ZANAFLEX ) 2 MG tablet Take 1 tablet (2 mg total) by mouth every 8 (eight) hours as needed for muscle spasms.   No facility-administered encounter medications on file as of 08/19/2024.

## 2024-08-20 ENCOUNTER — Encounter: Payer: Self-pay | Admitting: Family Medicine

## 2024-08-23 DIAGNOSIS — R300 Dysuria: Secondary | ICD-10-CM | POA: Diagnosis not present

## 2024-08-23 DIAGNOSIS — N390 Urinary tract infection, site not specified: Secondary | ICD-10-CM | POA: Diagnosis not present

## 2024-09-23 ENCOUNTER — Ambulatory Visit: Admitting: Neurosurgery

## 2024-09-23 ENCOUNTER — Ambulatory Visit: Payer: Self-pay

## 2024-09-23 VITALS — BP 102/56 | Ht 63.0 in | Wt 112.1 lb

## 2024-09-23 DIAGNOSIS — M5116 Intervertebral disc disorders with radiculopathy, lumbar region: Secondary | ICD-10-CM

## 2024-09-23 DIAGNOSIS — G8929 Other chronic pain: Secondary | ICD-10-CM

## 2024-09-23 DIAGNOSIS — M48061 Spinal stenosis, lumbar region without neurogenic claudication: Secondary | ICD-10-CM

## 2024-09-23 NOTE — Telephone Encounter (Signed)
 2nd callback attempt. LVM to call office back. Forwarding to office.   Copied from CRM #[id number]. Topic: Clinical - Red Word Triage >> Sep 23, 2024 11:57 AM Adelita BRAVO wrote: [facility] that prompted transfer to Nurse Triage: Extreme fatigue, patient wanting iron levels checked. Cough, stuffy nose, sore throat, body weakness, and body aches. >> Sep 23, 2024 12:03 PM Alexandria E wrote: Patient could not hold longer, requesting a call back from the nurse.

## 2024-09-23 NOTE — Progress Notes (Signed)
 "  Referring Physician:  Wendee Lynwood HERO, NP [address] [place],  [place] [address]  Primary Physician:  Wendee Lynwood HERO, NP  Discussed the use of AI scribe software for clinical note transcription with the patient, who gave verbal consent to proceed.  History of Present Illness Mercedes Mcintosh is a 74 year old female with lumbar degenerative disc disease, spinal stenosis, and osteoporosis who presents for evaluation of chronic low back pain with bilateral lower extremity radiculopathy. She has chronic low back pain described as tightness and aching across the low back, into the right buttock, and down both legs to the ankles, recently worse on the left. The pain intermittently limits her activity, but she remains active and continues to work as a engineer, agricultural, with significant fatigue after work. She denies new lower extremity weakness or loss of strength. She has completed conservative care including physical therapy and a lumbar epidural steroid injection last year that provided significant relief. She has not had additional injections since starting work. Surgery has been discussed but not pursued, and she prefers to avoid surgery if possible. She takes gabapentin  for neuropathic pain, now limited to one tablet at night due to work schedule and taking it on an empty stomach. She notes somnolence and cognitive fogginess with limited benefit, and her pain management provider increased the dose, though she has not tried other neuropathic agents. Lumbar spine imaging about six months ago showed multilevel disc collapse and foraminal narrowing.  Duration: 2 years  Severity: 8/10  Precipitating: aggravated by standing or sitting for to long Modifying factors: made better by heating pad, medications at times Weakness: none Timing: constant Bowel/Bladder Dysfunction: none  Conservative measures:  Physical therapy: Has participated in PT at [facility] and did some traction   Multimodal medical therapy including regular antiinflammatories: Meloxicam , Medrol  dosepak, Tizanidine , Gabapentin   Injections: Recent Injections with [facility]  Past Surgery: no spinal surgeries  Mercedes Mcintosh has no symptoms of cervical myelopathy.  The symptoms are causing a significant impact on the patient's life.   Review of Systems:  A 10 point review of systems is negative, except for the pertinent positives and negatives detailed in the HPI.  Past Medical History: Past Medical History:  Diagnosis Date   Depression    Osteoporosis     Past Surgical History: Past Surgical History:  Procedure Laterality Date   ABDOMINAL HYSTERECTOMY      Allergies: Allergies as of 09/23/2024 - Review Complete 09/23/2024  Allergen Reaction Noted   Metronidazole Rash 04/11/2015    Medications: Outpatient Encounter Medications as of 09/23/2024  Medication Sig   escitalopram  (LEXAPRO ) 20 MG tablet Take 1 tablet (20 mg total) by mouth daily.   gabapentin  (NEURONTIN ) 300 MG capsule Take 2 capsules (600 mg total) by mouth at bedtime. (Patient taking differently: Take 300 mg by mouth at bedtime.)   meloxicam  (MOBIC ) 7.5 MG tablet TAKE 1 TABLET BY MOUTH DAILY AS NEEDED FOR PAIN   tiZANidine  (ZANAFLEX ) 2 MG tablet Take 1 tablet (2 mg total) by mouth every 8 (eight) hours as needed for muscle spasms.   Multiple Vitamins-Minerals (CENTRUM SILVER 50+WOMEN PO) Take by mouth. (Patient not taking: Reported on 09/23/2024)   [DISCONTINUED] cyanocobalamin (VITAMIN B12) 1000 MCG tablet Take 1,000 mcg by mouth daily.   [DISCONTINUED] predniSONE  (DELTASONE ) 20 MG tablet 2 tabs po daily for 5 days, then 1 tab po daily for 5 days   No facility-administered encounter medications on file as of 09/23/2024.  Social History: Social History   Tobacco Use   Smoking status: Former    Current packs/day: 0.00    Average packs/day: 0.1 packs/day for 8.0 years (0.8 ttl pk-yrs)    Types: Cigarettes    Start  date: 2004    Quit date: 2012    Years since quitting: 14.0   Smokeless tobacco: Never  Vaping Use   Vaping status: Never Used  Substance Use Topics   Alcohol use: Yes    Comment: twice a month, one drink   Drug use: No    Family Medical History: Family History  Problem Relation Age of Onset   Breast cancer Paternal Aunt    Bursitis Mother    Arthritis Mother    Skin cancer Father    Lung cancer Father        nonsmoker   Lung cancer Paternal Grandfather        nonsmoker    Physical Examination:  NEUROLOGICAL:     Awake, alert, oriented to person, place, and time.  Speech is clear and fluent. Fund of knowledge is appropriate.   Cranial Nerves: Pupils equal round and reactive to light.  Facial tone is symmetric.    ROM of spine: Minimal tenderness palpation of her lumbar paraspinals.  Strength:  Side Iliopsoas Quads Hamstring PF DF EHL  R 5 5 5 5 5 5   L 5 5 5 5 5 5    Reflexes are 2+ and symmetric at the patella and achilles.    Clonus is not present.  Toes are down-going.  Bilateral upper and lower extremity sensation is intact to light touch.    Gait is normal.   No difficulty with tandem gait.   No evidence of dysmetria noted.  Medical Decision Making  Imaging:  EXAM: MRI LUMBAR SPINE WITHOUT CONTRAST   TECHNIQUE: Multiplanar, multisequence MR imaging of the lumbar spine was performed. No intravenous contrast was administered.   COMPARISON:  None Available.   FINDINGS: Segmentation: Standard.   Alignment:  Physiologic lumbar alignment is maintained.   Vertebrae: Degenerative endplate marrow changes at a few levels. No compression fractures.   Conus medullaris and cauda equina: The conus medullaris terminates at the level of L1-L2. The distal spinal cord signal intensity is normal.   Paraspinal and other soft tissues: The visualized abdomen and pelvis show no soft tissue abnormality. The visualized aorta is normal.   Disc levels:   L1-L2:  Mild disc bulge. Moderate facet arthropathy. No neuroforaminal stenosis. No spinal canal stenosis.   L2-L3: Disc bulge. Moderate bilateral facet arthropathy. Mild left neuroforaminal stenosis. No spinal canal stenosis.   L3-L4: Disc bulge. Moderate bilateral facet arthropathy. Mild bilateral neuroforaminal stenosis. Mild spinal canal stenosis.   L4-L5: Disc bulge. Moderate bilateral facet arthropathy. Severe left and mild right neuroforaminal stenosis. Mild spinal canal stenosis.   L5-S1: Disc bulge. Mild bilateral facet arthropathy. Severe bilateral neuroforaminal stenosis. Mild spinal canal stenosis.   IMPRESSION: 1. Severe neuroforaminal stenosis on the left at L4-L5 and bilaterally at L5-S1 secondary to disc bulging and facet arthropathy. 2. Mild canal stenosis at L3-L4, L4-L5, and L5-S1.   Dexa, osteopenia in spine, arm was osteoporosis   I have personally reviewed the images and agree with the above interpretation.  Assessment and Plan Assessment & Plan Lumbar degenerative disc disease with radiculopathy and spinal stenosis She has chronic lumbar degenerative disc disease with radiculopathy and spinal stenosis, resulting in bilateral lower extremity pain. Imaging demonstrates disc collapse and foraminal narrowing. She remains active  and ambulatory with preserved strength and no new weakness. Conservative management has provided partial relief, and she has not yet exhausted non-surgical options. Surgical intervention (laminectomy) is reserved for refractory symptoms or significant functional impairment. Laminectomy is anticipated to improve radicular pain if conservative measures fail. - Recommended continued conservative management with physical therapy and pain management interventions, including additional injections as needed. - Advised her to contact her pain management provider to arrange further injections, including consideration for the left side. - Communicated with her  pain management provider (Lateef's team) regarding ongoing symptoms and need for further injections.  Mercedes LELON Sharps MD [facility] [place]  "

## 2024-09-23 NOTE — Telephone Encounter (Signed)
 First attempt to contact pt, no answer, LVM for call back to PCP office. Placed in call back.   Copied from CRM #[id number]. Topic: Clinical - Red Word Triage >> Sep 23, 2024 11:57 AM Adelita BRAVO wrote: [facility] that prompted transfer to Nurse Triage: Extreme fatigue, patient wanting iron levels checked. Cough, stuffy nose, sore throat, body weakness, and body aches. >> Sep 23, 2024 12:03 PM Alexandria E wrote: Patient could not hold longer, requesting a call back from the nurse.

## 2024-09-25 NOTE — Telephone Encounter (Signed)
 Yes please have patient schedule an office visit with me

## 2024-09-25 NOTE — Telephone Encounter (Signed)
 Called and spoke with patient. Pt states that she will probably got UC for cold symptoms.  Pt states that he is extremely fatigued and wants to have her iron checked in office.  Will patient need office visit?  Please advise.

## 2024-09-28 ENCOUNTER — Other Ambulatory Visit: Payer: Self-pay | Admitting: Nurse Practitioner

## 2024-09-28 DIAGNOSIS — M48061 Spinal stenosis, lumbar region without neurogenic claudication: Secondary | ICD-10-CM

## 2024-09-28 DIAGNOSIS — G8929 Other chronic pain: Secondary | ICD-10-CM

## 2024-09-28 NOTE — Telephone Encounter (Unsigned)
 Copied from CRM #[id number]. Topic: Clinical - Medication Refill >> Sep 28, 2024  3:09 PM Delon T wrote: Medication: gabapentin  (NEURONTIN ) 300 MG capsule  Has the patient contacted their pharmacy? No (Agent: If no, request that the patient contact the pharmacy for the refill. If patient does not wish to contact the pharmacy document the reason why and proceed with request.) (Agent: If yes, when and what did the pharmacy advise?)  This is the patient's preferred pharmacy:  [facility] [address] [place] - [address] [place] [address] Phone: [phone] Fax: [phone]  Is this the correct pharmacy for this prescription? Yes If no, delete pharmacy and type the correct one.   Has the prescription been filled recently? Yes  Is the patient out of the medication? Yes  Has the patient been seen for an appointment in the last year OR does the patient have an upcoming appointment? Yes  Can we respond through MyChart? Yes  Agent: Please be advised that Rx refills may take up to 3 business days. We ask that you follow-up with your pharmacy.

## 2024-09-28 NOTE — Telephone Encounter (Signed)
 Pt said she is busy and will call back later.

## 2024-09-29 MED ORDER — GABAPENTIN 300 MG PO CAPS: 600.0000 mg | ORAL_CAPSULE | Freq: Every day | ORAL | 2 refills | Status: AC

## 2024-10-01 ENCOUNTER — Encounter: Payer: Self-pay | Admitting: Family Medicine

## 2024-10-01 ENCOUNTER — Ambulatory Visit (INDEPENDENT_AMBULATORY_CARE_PROVIDER_SITE_OTHER): Admitting: Family Medicine

## 2024-10-01 VITALS — BP 90/60 | HR 80 | Temp 97.4°F | Ht 63.0 in | Wt 113.4 lb

## 2024-10-01 DIAGNOSIS — J029 Acute pharyngitis, unspecified: Secondary | ICD-10-CM | POA: Diagnosis not present

## 2024-10-01 DIAGNOSIS — R5383 Other fatigue: Secondary | ICD-10-CM | POA: Diagnosis not present

## 2024-10-01 DIAGNOSIS — J069 Acute upper respiratory infection, unspecified: Secondary | ICD-10-CM

## 2024-10-01 LAB — POC COVID19 BINAXNOW: SARS Coronavirus 2 Ag: NEGATIVE

## 2024-10-01 LAB — POC INFLUENZA A&B (BINAX/QUICKVUE)
Influenza A, POC: NEGATIVE
Influenza B, POC: NEGATIVE

## 2024-10-01 NOTE — Assessment & Plan Note (Signed)
 Acute, some improvement with addition of Zyrtec-D last night. Negative COVID and flu testing. Recommend symptomatic and supportive care. No sign of bacterial superinfection  Return and ER precautions provided.

## 2024-10-01 NOTE — Assessment & Plan Note (Signed)
 Chronic, patient under some increased stress at work which could be contributing. Will evaluate for lab cause such as vitamin deficiency, anemia, new thyroid  issue etc. Patient states she sleeps 8 hours at night. She is on gabapentin  which could be causing some daytime fatigue but this is not new or at a different dose recently.

## 2024-10-01 NOTE — Progress Notes (Signed)
 "   Patient ID: Mercedes Mcintosh, female    DOB: 31-Jan-1951, 74 y.o.   MRN: [id number]  This visit was conducted in person.  BP 90/60   Pulse 80   Temp (!) 97.4 F (36.3 C) (Temporal)   Ht 5' 3 (1.6 m)   Wt 113 lb 6 oz (51.4 kg)   SpO2 97%   BMI 20.08 kg/m    CC:  Chief Complaint  Patient presents with   Fatigue        Sore Throat   Headache    Subjective:   HPI: Mercedes Mcintosh is a 74 y.o. female presenting on 10/01/2024 for Fatigue (/), Sore Throat, and Headache   ONgoing fatigue for months, since September  She has a lot of stress working at [employer]   She has been having headache, stuffy nose and ST. 1-2 weeks.  Occ cough, NO SOB. No ear pain, no face pain.  Has  body ache  No fever  Using Zyrtec D.        Relevant past medical, surgical, family and social history reviewed and updated as indicated. Interim medical history since our last visit reviewed. Allergies and medications reviewed and updated. Outpatient Medications Prior to Visit  Medication Sig Dispense Refill   escitalopram  (LEXAPRO ) 20 MG tablet Take 1 tablet (20 mg total) by mouth daily. 90 tablet 1   gabapentin  (NEURONTIN ) 300 MG capsule Take 2 capsules (600 mg total) by mouth at bedtime. 60 capsule 2   meloxicam  (MOBIC ) 7.5 MG tablet TAKE 1 TABLET BY MOUTH DAILY AS NEEDED FOR PAIN 30 tablet 1   Multiple Vitamins-Minerals (CENTRUM SILVER 50+WOMEN PO) Take by mouth.     tiZANidine  (ZANAFLEX ) 2 MG tablet Take 1 tablet (2 mg total) by mouth every 8 (eight) hours as needed for muscle spasms. 30 tablet 0   No facility-administered medications prior to visit.     Per HPI unless specifically indicated in ROS section below Review of Systems  Constitutional:  Negative for fatigue and fever.  HENT:  Negative for congestion.   Eyes:  Negative for pain.  Respiratory:  Negative for cough and shortness of breath.   Cardiovascular:  Negative for chest pain, palpitations and leg swelling.   Gastrointestinal:  Negative for abdominal pain.  Genitourinary:  Negative for dysuria and vaginal bleeding.  Musculoskeletal:  Negative for back pain.  Neurological:  Negative for syncope, light-headedness and headaches.  Psychiatric/Behavioral:  Negative for dysphoric mood.    Objective:  BP 90/60   Pulse 80   Temp (!) 97.4 F (36.3 C) (Temporal)   Ht 5' 3 (1.6 m)   Wt 113 lb 6 oz (51.4 kg)   SpO2 97%   BMI 20.08 kg/m   Wt Readings from Last 3 Encounters:  10/01/24 113 lb 6 oz (51.4 kg)  09/23/24 112 lb 2 oz (50.9 kg)  08/19/24 114 lb 2 oz (51.8 kg)      Physical Exam Constitutional:      General: She is not in acute distress.    Appearance: Normal appearance. She is well-developed. She is not ill-appearing or toxic-appearing.  HENT:     Head: Normocephalic.     Right Ear: Hearing, tympanic membrane, ear canal and external ear normal. No middle ear effusion. Tympanic membrane is not erythematous, retracted or bulging.     Left Ear: Hearing, tympanic membrane, ear canal and external ear normal.  No middle ear effusion. Tympanic membrane is not erythematous, retracted or bulging.  Nose: Mucosal edema and congestion present. No rhinorrhea.     Right Sinus: No maxillary sinus tenderness or frontal sinus tenderness.     Left Sinus: No maxillary sinus tenderness or frontal sinus tenderness.     Mouth/Throat:     Pharynx: Uvula midline. Posterior oropharyngeal erythema present.  Eyes:     General: Lids are normal. Lids are everted, no foreign bodies appreciated.     Conjunctiva/sclera: Conjunctivae normal.     Pupils: Pupils are equal, round, and reactive to light.  Neck:     Thyroid : No thyroid  mass or thyromegaly.     Vascular: No carotid bruit.     Trachea: Trachea normal.  Cardiovascular:     Rate and Rhythm: Normal rate and regular rhythm.     Pulses: Normal pulses.     Heart sounds: Normal heart sounds, S1 normal and S2 normal. No murmur heard.    No friction rub.  No gallop.  Pulmonary:     Effort: Pulmonary effort is normal. No tachypnea or respiratory distress.     Breath sounds: Normal breath sounds. No decreased breath sounds, wheezing, rhonchi or rales.  Abdominal:     General: Bowel sounds are normal.     Palpations: Abdomen is soft.     Tenderness: There is no abdominal tenderness.  Musculoskeletal:     Cervical back: Normal range of motion and neck supple.  Skin:    General: Skin is warm and dry.     Findings: No rash.  Neurological:     Mental Status: She is alert.  Psychiatric:        Mood and Affect: Mood is not anxious or depressed.        Speech: Speech normal.        Behavior: Behavior normal. Behavior is cooperative.        Thought Content: Thought content normal.        Judgment: Judgment normal.       Results for orders placed or performed in visit on 10/01/24  POC COVID-19   Collection Time: 10/01/24  4:23 PM  Result Value Ref Range   SARS Coronavirus 2 Ag Negative Negative  POC Influenza A&B (Binax test)   Collection Time: 10/01/24  4:24 PM  Result Value Ref Range   Influenza A, POC Negative Negative   Influenza B, POC Negative Negative    Assessment and Plan  Sore throat -     POC COVID-19 BinaxNow -     POC Influenza A&B(BINAX/QUICKVUE)  Other fatigue Assessment & Plan: Chronic, patient under some increased stress at work which could be contributing. Will evaluate for lab cause such as vitamin deficiency, anemia, new thyroid  issue etc. Patient states she sleeps 8 hours at night. She is on gabapentin  which could be causing some daytime fatigue but this is not new or at a different dose recently.  Orders: -     CBC with Differential/Platelet -     IBC + Ferritin -     Vitamin B12 -     TSH -     VITAMIN D  25 Hydroxy (Vit-D Deficiency, Fractures) -     Comprehensive metabolic panel with GFR  Viral URI with cough Assessment & Plan: Acute, some improvement with addition of Zyrtec-D last  night. Negative COVID and flu testing. Recommend symptomatic and supportive care. No sign of bacterial superinfection  Return and ER precautions provided.      No follow-ups on file.   Greig Ring, MD  "

## 2024-10-02 ENCOUNTER — Ambulatory Visit: Payer: Self-pay | Admitting: Family Medicine

## 2024-10-02 LAB — COMPREHENSIVE METABOLIC PANEL WITH GFR
ALT: 12 U/L (ref 3–35)
AST: 14 U/L (ref 5–37)
Albumin: 3.7 g/dL (ref 3.5–5.2)
Alkaline Phosphatase: 74 U/L (ref 39–117)
BUN: 9 mg/dL (ref 6–23)
CO2: 28 meq/L (ref 19–32)
Calcium: 9 mg/dL (ref 8.4–10.5)
Chloride: 105 meq/L (ref 96–112)
Creatinine, Ser: 0.68 mg/dL (ref 0.40–1.20)
GFR: 87.44 mL/min
Glucose, Bld: 95 mg/dL (ref 70–99)
Potassium: 3.9 meq/L (ref 3.5–5.1)
Sodium: 139 meq/L (ref 135–145)
Total Bilirubin: 0.2 mg/dL (ref 0.2–1.2)
Total Protein: 6.3 g/dL (ref 6.0–8.3)

## 2024-10-02 LAB — CBC WITH DIFFERENTIAL/PLATELET
Basophils Absolute: 0.1 K/uL (ref 0.0–0.1)
Basophils Relative: 1 % (ref 0.0–3.0)
Eosinophils Absolute: 0.2 K/uL (ref 0.0–0.7)
Eosinophils Relative: 4.4 % (ref 0.0–5.0)
HCT: 37.7 % (ref 36.0–46.0)
Hemoglobin: 12.8 g/dL (ref 12.0–15.0)
Lymphocytes Relative: 49.1 % — ABNORMAL HIGH (ref 12.0–46.0)
Lymphs Abs: 2.5 K/uL (ref 0.7–4.0)
MCHC: 33.9 g/dL (ref 30.0–36.0)
MCV: 94.4 fl (ref 78.0–100.0)
Monocytes Absolute: 0.4 K/uL (ref 0.1–1.0)
Monocytes Relative: 8.6 % (ref 3.0–12.0)
Neutro Abs: 1.9 K/uL (ref 1.4–7.7)
Neutrophils Relative %: 36.9 % — ABNORMAL LOW (ref 43.0–77.0)
Platelets: 288 K/uL (ref 150.0–400.0)
RBC: 3.99 Mil/uL (ref 3.87–5.11)
RDW: 13.2 % (ref 11.5–15.5)
WBC: 5 K/uL (ref 4.0–10.5)

## 2024-10-02 LAB — TSH: TSH: 0.43 u[IU]/mL (ref 0.35–5.50)

## 2024-10-02 LAB — VITAMIN B12: Vitamin B-12: 367 pg/mL (ref 211–911)

## 2024-10-02 LAB — IBC + FERRITIN
Ferritin: 32 ng/mL (ref 10.0–291.0)
Iron: 85 ug/dL (ref 42–145)
Saturation Ratios: 25.3 % (ref 20.0–50.0)
TIBC: 336 ug/dL (ref 250.0–450.0)
Transferrin: 240 mg/dL (ref 212.0–360.0)

## 2024-10-02 LAB — VITAMIN D 25 HYDROXY (VIT D DEFICIENCY, FRACTURES): VITD: 27.84 ng/mL — ABNORMAL LOW (ref 30.00–100.00)

## 2024-10-06 ENCOUNTER — Other Ambulatory Visit: Payer: Self-pay | Admitting: Family Medicine

## 2024-10-06 MED ORDER — VITAMIN D3 1.25 MG (50000 UT) PO CAPS: 1.0000 | ORAL_CAPSULE | ORAL | 0 refills | Status: AC

## 2024-10-06 NOTE — Telephone Encounter (Signed)
 Copied from CRM [id number]. Topic: Clinical - Lab/Test Results >> Oct 06, 2024 11:31 AM Treva T wrote: Reason for CRM: Pt calling office, reports she missed a call in reference to lab results, and Vitamin D .   Pt is calling requesting to speak to nurse in regards to results, and other questions she has regarding those results.  Can be reached at [phone]  Aware of same day call back.

## 2024-10-06 NOTE — Progress Notes (Signed)
 See result note.  Left message for patient with Vit D instructions.

## 2024-10-06 NOTE — Progress Notes (Signed)
 See note

## 2024-10-06 NOTE — Telephone Encounter (Signed)
 I have not called patient back about Vit D yet because I am still awaiting response from Dr. Ezzard.

## 2024-10-07 ENCOUNTER — Telehealth: Payer: Self-pay | Admitting: Nurse Practitioner

## 2024-10-07 DIAGNOSIS — G8929 Other chronic pain: Secondary | ICD-10-CM

## 2024-10-07 NOTE — Telephone Encounter (Unsigned)
 Copied from CRM #[id number]. Topic: Referral - Request for Referral >> Oct 07, 2024 11:26 AM Mesmerise C wrote: Did the patient discuss referral with their provider in the last year? Yes (If No - schedule appointment) (If Yes - send message)  Appointment offered? No  Type of order/referral and detailed reason for visit: Physical Therapy  Preference of office, provider, location: [facility] [address]  If referral order, have you been seen by this specialty before? Yes (If Yes, this issue or another issue? When? Where?  Can we respond through MyChart? Yes or a phone call as well

## 2024-10-07 NOTE — Telephone Encounter (Signed)
 She was referred on 03/13/2024. Does she need another referral

## 2024-10-08 NOTE — Telephone Encounter (Unsigned)
 Copied from CRM #[id number]. Topic: General - Other >> Oct 08, 2024  3:17 PM Berwyn MATSU wrote: Reason for CRM: Patient called in returning Sebastian Shu, CMA . I called [facility] spoke with jenny who advised to send CRM.   May you please assist.

## 2024-10-08 NOTE — Telephone Encounter (Signed)
"  Left voicemail for patient to call the office back.     "

## 2024-10-09 NOTE — Telephone Encounter (Signed)
 Referral placed

## 2024-10-09 NOTE — Telephone Encounter (Signed)
 Called and spoke with patient.  Informed her of referral placed.  Pt verbalized understanding.  Pt asked about vitamin D  being out of stock.  Advised pt to reach out to her pharmacy to check back and/or see if other [facility] have the medication in stock.

## 2024-10-09 NOTE — Telephone Encounter (Signed)
 Spoke with patient and advised patient that she had a referral done for PT in June. Patients states she did and did 5 sessions and was done with PT. Patient states she is starting to have back pain and wanted to go back to PT. When she called she was told she needed a new referral placed to them.

## 2024-10-09 NOTE — Addendum Note (Signed)
 Addended by: WENDEE LYNWOOD HERO on: 10/09/2024 02:00 PM   Modules accepted: Orders

## 2024-10-21 ENCOUNTER — Ambulatory Visit (HOSPITAL_BASED_OUTPATIENT_CLINIC_OR_DEPARTMENT_OTHER): Admitting: Student in an Organized Health Care Education/Training Program

## 2024-10-21 ENCOUNTER — Encounter: Payer: Self-pay | Admitting: Student in an Organized Health Care Education/Training Program

## 2024-10-21 ENCOUNTER — Other Ambulatory Visit: Payer: Self-pay | Admitting: Student in an Organized Health Care Education/Training Program

## 2024-10-21 ENCOUNTER — Ambulatory Visit
Admission: RE | Admit: 2024-10-21 | Discharge: 2024-10-21 | Disposition: A | Source: Ambulatory Visit | Attending: Student in an Organized Health Care Education/Training Program | Admitting: Student in an Organized Health Care Education/Training Program

## 2024-10-21 VITALS — BP 110/70 | HR 72 | Temp 98.2°F | Resp 16 | Ht 63.0 in | Wt 114.0 lb

## 2024-10-21 DIAGNOSIS — M5416 Radiculopathy, lumbar region: Secondary | ICD-10-CM | POA: Diagnosis not present

## 2024-10-21 DIAGNOSIS — G8929 Other chronic pain: Secondary | ICD-10-CM | POA: Diagnosis not present

## 2024-10-21 DIAGNOSIS — M48061 Spinal stenosis, lumbar region without neurogenic claudication: Secondary | ICD-10-CM

## 2024-10-21 DIAGNOSIS — M5442 Lumbago with sciatica, left side: Secondary | ICD-10-CM | POA: Diagnosis not present

## 2024-10-21 DIAGNOSIS — M5441 Lumbago with sciatica, right side: Secondary | ICD-10-CM | POA: Diagnosis not present

## 2024-10-21 DIAGNOSIS — G894 Chronic pain syndrome: Secondary | ICD-10-CM

## 2024-10-21 MED ORDER — LIDOCAINE HCL 2 % IJ SOLN
20.0000 mL | Freq: Once | INTRAMUSCULAR | Status: AC
  Administered 2024-10-21: 400 mg

## 2024-10-21 MED ORDER — LIDOCAINE HCL 2 % IJ SOLN
INTRAMUSCULAR | Status: AC
  Filled 2024-10-21: qty 20

## 2024-10-21 MED ORDER — IOHEXOL 180 MG/ML  SOLN
10.0000 mL | Freq: Once | INTRAMUSCULAR | Status: AC
  Administered 2024-10-21: 10 mL via EPIDURAL

## 2024-10-21 MED ORDER — SODIUM CHLORIDE (PF) 0.9 % IJ SOLN
INTRAMUSCULAR | Status: AC
  Filled 2024-10-21: qty 10

## 2024-10-21 MED ORDER — ROPIVACAINE HCL 2 MG/ML IJ SOLN
2.0000 mL | Freq: Once | INTRAMUSCULAR | Status: AC
  Administered 2024-10-21: 2 mL via EPIDURAL

## 2024-10-21 MED ORDER — DEXAMETHASONE SOD PHOSPHATE PF 10 MG/ML IJ SOLN
20.0000 mg | Freq: Once | INTRAMUSCULAR | Status: AC
  Administered 2024-10-21: 20 mg

## 2024-10-21 MED ORDER — ROPIVACAINE HCL 2 MG/ML IJ SOLN
INTRAMUSCULAR | Status: AC
  Filled 2024-10-21: qty 20

## 2024-10-21 MED ORDER — SODIUM CHLORIDE 0.9% FLUSH
2.0000 mL | Freq: Once | INTRAVENOUS | Status: AC
  Administered 2024-10-21: 2 mL

## 2024-10-21 MED ORDER — DIAZEPAM 5 MG PO TABS
ORAL_TABLET | ORAL | Status: AC
  Filled 2024-10-21: qty 1

## 2024-10-21 MED ORDER — DEXAMETHASONE SOD PHOSPHATE PF 10 MG/ML IJ SOLN
INTRAMUSCULAR | Status: AC
  Filled 2024-10-21: qty 1

## 2024-10-21 NOTE — Progress Notes (Signed)
 PROVIDER NOTE: Interpretation of information contained herein should be left to medically-trained personnel. Specific patient instructions are provided elsewhere under Patient Instructions section of medical record. This document was created in part using STT-dictation technology, any transcriptional errors that may result from this process are unintentional.  Patient: Mercedes Mcintosh Type: Established DOB: July 12, 1951 MRN: [id number] PCP: Wendee Lynwood HERO, NP  Service: Procedure DOS: 10/21/2024 Setting: Ambulatory Location: Ambulatory outpatient facility Delivery: Face-to-face Provider: Wallie Sherry, MD Specialty: Interventional Pain Management Specialty designation: 09 Location: Outpatient facility Ref. Prov.: Wendee Lynwood HERO, NP       Interventional Therapy   Procedure: Lumbar trans-foraminal epidural steroid injection (L-TFESI) #2  Laterality: Left (-LT)  Level: L4 and L5 nerve root(s) Imaging: Fluoroscopy-guided         Anesthesia: Local anesthesia (1-2% Lidocaine ) Anxiolysis: 5 mg p.o. Valium  DOS: 10/21/2024  Performed by: Wallie Sherry, MD  Purpose: Diagnostic/Therapeutic Indications: Lumbar radicular pain severe enough to impact quality of life or function. 1. Chronic radicular lumbar pain   2. Neuroforaminal stenosis of lumbar spine   3. Chronic bilateral low back pain with bilateral sciatica    NAS-11 Pain score:   Pre-procedure: 8 /10   Post-procedure: 3/10    Position / Prep / Materials:  Position: Prone  Prep solution: ChloraPrep (2% chlorhexidine gluconate and 70% isopropyl alcohol) Prep Area: Entire Posterior Lumbosacral Area.  From the lower tip of the scapula down to the tailbone and from flank to flank. Materials:  Tray: Block Needle(s):  Type: Spinal  Gauge (G): 22  Length: 3.5-in  Qty: 2     H&P (Pre-op Assessment):  Mercedes Mcintosh is a 74 y.o. (year old), female patient, seen today for interventional treatment. She  has a past surgical history that includes  Abdominal hysterectomy. Mercedes Mcintosh has a current medication list which includes the following prescription(s): vitamin d3, escitalopram , gabapentin , meloxicam , multiple vitamins-minerals, and tizanidine , and the following Facility-Administered Medications: dexamethasone , iohexol , lidocaine , ropivacaine  (pf) 2 mg/ml (0.2%), and sodium chloride  flush. Her primarily concern today is the Back Pain (Left, lower)  Initial Vital Signs:  Pulse/HCG Rate: 72ECG Heart Rate: 65 Temp: 98.2 F (36.8 C) Resp: 16 BP: 106/62 SpO2: 98 %  BMI: Estimated body mass index is 20.19 kg/m as calculated from the following:   Height as of this encounter: 5' 3 (1.6 m).   Weight as of this encounter: 114 lb (51.7 kg).  Risk Assessment: Allergies: Reviewed. She is allergic to metronidazole.  Allergy Precautions: None required Coagulopathies: Reviewed. None identified.  Blood-thinner therapy: None at this time Active Infection(s): Reviewed. None identified. Mercedes Mcintosh is afebrile  Site Confirmation: Mercedes Mcintosh was asked to confirm the procedure and laterality before marking the site Procedure checklist: Completed Consent: Before the procedure and under the influence of no sedative(s), amnesic(s), or anxiolytics, the patient was informed of the treatment options, risks and possible complications. To fulfill our ethical and legal obligations, as recommended by the American Medical Association's Code of Ethics, I have informed the patient of my clinical impression; the nature and purpose of the treatment or procedure; the risks, benefits, and possible complications of the intervention; the alternatives, including doing nothing; the risk(s) and benefit(s) of the alternative treatment(s) or procedure(s); and the risk(s) and benefit(s) of doing nothing. The patient was provided information about the general risks and possible complications associated with the procedure. These may include, but are not limited to: failure to  achieve desired goals, infection, bleeding, organ or nerve damage, allergic reactions, paralysis, and  death. In addition, the patient was informed of those risks and complications associated to Spine-related procedures, such as failure to decrease pain; infection (i.e.: Meningitis, epidural or intraspinal abscess); bleeding (i.e.: epidural hematoma, subarachnoid hemorrhage, or any other type of intraspinal or peri-dural bleeding); organ or nerve damage (i.e.: Any type of peripheral nerve, nerve root, or spinal cord injury) with subsequent damage to sensory, motor, and/or autonomic systems, resulting in permanent pain, numbness, and/or weakness of one or several areas of the body; allergic reactions; (i.e.: anaphylactic reaction); and/or death. Furthermore, the patient was informed of those risks and complications associated with the medications. These include, but are not limited to: allergic reactions (i.e.: anaphylactic or anaphylactoid reaction(s)); adrenal axis suppression; blood sugar elevation that in diabetics may result in ketoacidosis or comma; water retention that in patients with history of congestive heart failure may result in shortness of breath, pulmonary edema, and decompensation with resultant heart failure; weight gain; swelling or edema; medication-induced neural toxicity; particulate matter embolism and blood vessel occlusion with resultant organ, and/or nervous system infarction; and/or aseptic necrosis of one or more joints. Finally, the patient was informed that Medicine is not an exact science; therefore, there is also the possibility of unforeseen or unpredictable risks and/or possible complications that may result in a catastrophic outcome. The patient indicated having understood very clearly. We have given the patient no guarantees and we have made no promises. Enough time was given to the patient to ask questions, all of which were answered to the patient's satisfaction. Mercedes Mcintosh has  indicated that she wanted to continue with the procedure. Attestation: I, the ordering provider, attest that I have discussed with the patient the benefits, risks, side-effects, alternatives, likelihood of achieving goals, and potential problems during recovery for the procedure that I have provided informed consent. Date  Time: 10/21/2024  2:01 PM  Pre-Procedure Preparation:  Monitoring: As per clinic protocol. Respiration, ETCO2, SpO2, BP, heart rate and rhythm monitor placed and checked for adequate function Safety Precautions: Patient was assessed for positional comfort and pressure points before starting the procedure. Time-out: I initiated and conducted the Time-out before starting the procedure, as per protocol. The patient was asked to participate by confirming the accuracy of the Time Out information. Verification of the correct person, site, and procedure were performed and confirmed by me, the nursing staff, and the patient. Time-out conducted as per Joint Commission's Universal Protocol (UP.01.01.01). Time: 1422 Start Time: 1422 hrs.  Description/Narrative of Procedure:          Target: The 6 o'clock position under the pedicle, on the affected side. Region: Posterolateral Lumbosacral Approach: Posterior Percutaneous Paravertebral approach.  Rationale (medical necessity): procedure needed and proper for the diagnosis and/or treatment of the patient's medical symptoms and needs. Procedural Technique Safety Precautions: Aspiration looking for blood return was conducted prior to all injections. At no point did we inject any substances, as a needle was being advanced. No attempts were made at seeking any paresthesias. Safe injection practices and needle disposal techniques used. Medications properly checked for expiration dates. SDV (single dose vial) medications used. Description of the Procedure: Protocol guidelines were followed. The patient was placed in position over the procedure  table. The target area was identified and the area prepped in the usual manner. Skin & deeper tissues infiltrated with local anesthetic. Appropriate amount of time allowed to pass for local anesthetics to take effect. The procedure needles were then advanced to the target area. Proper needle placement secured. Negative aspiration confirmed.  Solution injected in intermittent fashion, asking for systemic symptoms every 0.5cc of injectate. The needles were then removed and the area cleansed, making sure to leave some of the prepping solution back to take advantage of its long term bactericidal properties.  Vitals:   10/21/24 1400 10/21/24 1420 10/21/24 1430  BP: 106/62 104/68 110/70  Pulse: 72    Resp: 16 17 16   Temp: 98.2 F (36.8 C)    TempSrc: Temporal    SpO2: 98% 98% 99%  Weight: 114 lb (51.7 kg)    Height: 5' 3 (1.6 m)      Start Time: 1422 hrs. End Time: 1427 hrs.  Imaging Guidance (Spinal):          Type of Imaging Technique: Fluoroscopy Guidance (Spinal) Indication(s): Fluoroscopy guidance for needle placement to enhance accuracy in procedures requiring precise needle localization for targeted delivery of medication in or near specific anatomical locations not easily accessible without such real-time imaging assistance. Exposure Time: Please see nurses notes. Contrast: Before injecting any contrast, we confirmed that the patient did not have an allergy to iodine, shellfish, or radiological contrast. Once satisfactory needle placement was completed at the desired level, radiological contrast was injected. Contrast injected under live fluoroscopy. No contrast complications. See chart for type and volume of contrast used. Fluoroscopic Guidance: I was personally present during the use of fluoroscopy. Tunnel Vision Technique used to obtain the best possible view of the target area. Parallax error corrected before commencing the procedure. Direction-depth-direction technique used to  introduce the needle under continuous pulsed fluoroscopy. Once target was reached, antero-posterior, oblique, and lateral fluoroscopic projection used confirm needle placement in all planes. Images permanently stored in EMR. Interpretation: I personally interpreted the imaging intraoperatively. Adequate needle placement confirmed in multiple planes. Appropriate spread of contrast into desired area was observed. No evidence of afferent or efferent intravascular uptake. No intrathecal or subarachnoid spread observed. Permanent images saved into the patient's record.  Post-operative Assessment:  Post-procedure Vital Signs:  Pulse/HCG Rate: 7266 Temp: 98.2 F (36.8 C) Resp: 16 BP: 110/70 SpO2: 99 %  EBL: None  Complications: No immediate post-treatment complications observed by team, or reported by patient.  Note: The patient tolerated the entire procedure well. A repeat set of vitals were taken after the procedure and the patient was kept under observation following institutional policy, for this type of procedure. Post-procedural neurological assessment was performed, showing return to baseline, prior to discharge. The patient was provided with post-procedure discharge instructions, including a section on how to identify potential problems. Should any problems arise concerning this procedure, the patient was given instructions to immediately contact us , at any time, without hesitation. In any case, we plan to contact the patient by telephone for a follow-up status report regarding this interventional procedure.  Comments:  No additional relevant information.  Plan of Care (POC)  Orders:  No orders of the defined types were placed in this encounter.   Medications ordered for procedure: Meds ordered this encounter  Medications   iohexol  (OMNIPAQUE ) 180 MG/ML injection 10 mL    Must be Myelogram-compatible. If not available, you may substitute with a water-soluble, non-ionic, hypoallergenic,  myelogram-compatible radiological contrast medium.   lidocaine  (XYLOCAINE ) 2 % (with pres) injection 400 mg   sodium chloride  flush (NS) 0.9 % injection 2 mL    This is for a two (2) level block. Use two (2) syringes and divide content in half.   ropivacaine  (PF) 2 mg/mL (0.2%) (NAROPIN ) injection 2 mL  This is for a two (2) level block. Use two (2) syringes and divide content in half.   dexamethasone  (DECADRON ) injection 20 mg    This is for a two (2) level block. Use two (2) syringes and divide content in half.   Medications administered: Jocelyn P. Bohac had no medications administered during this visit.  See the medical record for exact dosing, route, and time of administration.    Right L4 and L5 TF ESI 04/13/24 Left L4 and L5 TF ESI 10/21/24    Follow-up plan:   Return in about 2 weeks (around 11/04/2024) for PPE F2F Vinicius Brockman.     Recent Visits No visits were found meeting these conditions. Showing recent visits within past 90 days and meeting all other requirements Today's Visits Date Type Provider Dept  10/21/24 Procedure visit Marcelino Nurse, MD [facility]  Showing today's visits and meeting all other requirements Future Appointments Date Type Provider Dept  11/05/24 Appointment Marcelino Nurse, MD [facility]  Showing future appointments within next 90 days and meeting all other requirements   Disposition: Discharge home  Discharge (Date  Time): 10/21/2024;   hrs.   Primary Care Physician: Wendee Lynwood HERO, NP Location: [facility] Note by: Nurse Marcelino, MD (TTS technology used. I apologize for any typographical errors that were not detected and corrected.) Date: 10/21/2024; Time: 2:46 PM  Disclaimer:  Medicine is not an visual merchandiser. The only guarantee in medicine is that nothing is guaranteed. It is important to note that the decision to proceed with this intervention was based on the information collected from the  patient. The Data and conclusions were drawn from the patient's questionnaire, the interview, and the physical examination. Because the information was provided in large part by the patient, it cannot be guaranteed that it has not been purposely or unconsciously manipulated. Every effort has been made to obtain as much relevant data as possible for this evaluation. It is important to note that the conclusions that lead to this procedure are derived in large part from the available data. Always take into account that the treatment will also be dependent on availability of resources and existing treatment guidelines, considered by other Pain Management Practitioners as being common knowledge and practice, at the time of the intervention. For Medico-Legal purposes, it is also important to point out that variation in procedural techniques and pharmacological choices are the acceptable norm. The indications, contraindications, technique, and results of the above procedure should only be interpreted and judged by a Board-Certified Interventional Pain Specialist with extensive familiarity and expertise in the same exact procedure and technique.

## 2024-10-21 NOTE — Patient Instructions (Signed)

## 2024-10-22 ENCOUNTER — Telehealth: Payer: Self-pay

## 2024-10-22 NOTE — Telephone Encounter (Signed)
 Called PP  denies any needs at this time. Instructed to call if needed.Mercedes Mcintosh She was concerned about pain in right side.

## 2024-11-05 ENCOUNTER — Ambulatory Visit: Admitting: Student in an Organized Health Care Education/Training Program

## 2024-12-18 ENCOUNTER — Ambulatory Visit

## 2025-01-28 ENCOUNTER — Ambulatory Visit
